# Patient Record
Sex: Female | Born: 1990
Health system: Southern US, Community
[De-identification: ages and names within clinical notes are randomized; demographics above are authoritative.]

## PROBLEM LIST (undated history)

## (undated) ENCOUNTER — Inpatient Hospital Stay (HOSPITAL_COMMUNITY): Payer: Self-pay

## (undated) DIAGNOSIS — F1920 Other psychoactive substance dependence, uncomplicated: Secondary | ICD-10-CM

## (undated) DIAGNOSIS — F1120 Opioid dependence, uncomplicated: Secondary | ICD-10-CM

## (undated) DIAGNOSIS — R630 Anorexia: Secondary | ICD-10-CM

## (undated) DIAGNOSIS — F1911 Other psychoactive substance abuse, in remission: Secondary | ICD-10-CM

## (undated) DIAGNOSIS — B182 Chronic viral hepatitis C: Secondary | ICD-10-CM

## (undated) DIAGNOSIS — F1111 Opioid abuse, in remission: Secondary | ICD-10-CM

## (undated) DIAGNOSIS — O98419 Viral hepatitis complicating pregnancy, unspecified trimester: Secondary | ICD-10-CM

## (undated) DIAGNOSIS — B1920 Unspecified viral hepatitis C without hepatic coma: Secondary | ICD-10-CM

## (undated) DIAGNOSIS — F1110 Opioid abuse, uncomplicated: Secondary | ICD-10-CM

## (undated) HISTORY — DX: Other psychoactive substance abuse, in remission: F19.11

## (undated) HISTORY — DX: Viral hepatitis complicating pregnancy, unspecified trimester: O98.419

## (undated) HISTORY — DX: Opioid abuse, in remission: F11.11

## (undated) HISTORY — DX: Other psychoactive substance dependence, uncomplicated: F19.20

## (undated) HISTORY — DX: Chronic viral hepatitis C: B18.2

## (undated) HISTORY — DX: Opioid abuse, uncomplicated: F11.10

## (undated) HISTORY — DX: Opioid dependence, uncomplicated: F11.20

## (undated) HISTORY — DX: Unspecified viral hepatitis C without hepatic coma: B19.20

## (undated) HISTORY — PX: WISDOM TOOTH EXTRACTION: SHX21

---

## 2011-09-19 ENCOUNTER — Emergency Department (HOSPITAL_COMMUNITY)
Admission: EM | Admit: 2011-09-19 | Discharge: 2011-09-19 | Disposition: A | Discharge status: Home / Self Care | Payer: Self-pay | Attending: Emergency Medicine | Admitting: Emergency Medicine

## 2011-09-19 ENCOUNTER — Encounter (HOSPITAL_COMMUNITY): Payer: Self-pay | Admitting: Emergency Medicine

## 2011-09-19 DIAGNOSIS — F17200 Nicotine dependence, unspecified, uncomplicated: Secondary | ICD-10-CM | POA: Insufficient documentation

## 2011-09-19 DIAGNOSIS — W261XXA Contact with sword or dagger, initial encounter: Secondary | ICD-10-CM | POA: Insufficient documentation

## 2011-09-19 DIAGNOSIS — M79609 Pain in unspecified limb: Secondary | ICD-10-CM | POA: Insufficient documentation

## 2011-09-19 DIAGNOSIS — W260XXA Contact with knife, initial encounter: Secondary | ICD-10-CM | POA: Insufficient documentation

## 2011-09-19 DIAGNOSIS — S61209A Unspecified open wound of unspecified finger without damage to nail, initial encounter: Secondary | ICD-10-CM | POA: Insufficient documentation

## 2011-09-19 MED ORDER — TETANUS-DIPHTH-ACELL PERTUSSIS 5-2.5-18.5 LF-MCG/0.5 IM SUSP
0.5000 mL | Freq: Once | INTRAMUSCULAR | Status: AC
  Administered 2011-09-19: 0.5 mL via INTRAMUSCULAR
  Filled 2011-09-19: qty 0.5

## 2011-09-19 MED ORDER — HYDROCODONE-ACETAMINOPHEN 5-325 MG PO TABS: 1.0000 | ORAL_TABLET | ORAL | Status: AC | PRN

## 2011-09-19 MED ORDER — SILVER NITRATE-POT NITRATE 75-25 % EX MISC
3.0000 | CUTANEOUS | Status: AC
  Administered 2011-09-19: 3 via TOPICAL
  Filled 2011-09-19: qty 3

## 2011-09-19 NOTE — ED Notes (Signed)
Patient cut tip of finger bleeding currently controled with ED interventions pressure dressing applied. Patient denies pain

## 2011-09-19 NOTE — ED Provider Notes (Signed)
Medical screening examination/treatment/procedure(s) were performed by non-physician practitioner and as supervising physician I was immediately available for consultation/collaboration.   Edson Deridder L Ugochukwu Chichester, MD 09/19/11 2318 

## 2011-09-19 NOTE — Discharge Instructions (Signed)
Fingertip Injuries and Amputations Fingertip injuries are common and often get injured because they are last to escape when pulling your hand out of harm's way. You have amputated (cut off) part of your finger. How this turns out depends largely on how much was amputated. If just the tip is amputated, often the end of the finger will grow back and the finger may return to much the same as it was before the injury.  If more of the finger is missing, your caregiver has done the best with the tissue remaining to allow you to keep as much finger as is possible. Your caregiver after checking your injury has tried to leave you with a painless fingertip that has durable, feeling skin. If possible, your caregiver has tried to maintain the finger's length and appearance and preserve its fingernail.  Please read the instructions outlined below and refer to this sheet in the next few weeks. These instructions provide you with general information on caring for yourself. Your caregiver may also give you specific instructions. While your treatment has been done according to the most current medical practices available, unavoidable complications occasionally occur. If you have any problems or questions after discharge, please call your caregiver. HOME CARE INSTRUCTIONS   You may resume normal diet and activities as directed or allowed.   Keep your hand elevated above the level of your heart. This helps decrease pain and swelling.   Keep ice packs (or a bag of ice wrapped in a towel) on the injured area for 15 to 20 minutes, 3 to 4 times per day, for the first two days.   Change dressings if necessary or as directed.   Clean the wound daily or as directed.   Only take over-the-counter or prescription medicines for pain, discomfort, or fever as directed by your caregiver.   Keep appointments as directed.  SEEK IMMEDIATE MEDICAL CARE IF:  You develop redness, swelling, numbness or increasing pain in the wound.     There is pus coming from the wound.   You develop an unexplained oral temperature above 102 F (38.9 C) or as your caregiver suggests.   There is a foul (bad) smell coming from the wound or dressing.   There is a breaking open of the wound (edges not staying together) after sutures or staples have been removed.  MAKE SURE YOU:   Understand these instructions.   Will watch your condition.   Will get help right away if you are not doing well or get worse.  Document Released: 06/09/2005 Document Revised: 03/31/2011 Document Reviewed: 05/08/2008 [employer] Patient Information 2012 [employer]. 

## 2011-09-19 NOTE — ED Notes (Addendum)
Laceration to R middle finger around 6:30 while closing a pocket knife. Pt cut approx 1-2 cm section of skin off finger.  New bandage applied on arrival.

## 2011-09-19 NOTE — ED Provider Notes (Signed)
History     CSN: [id number]  Arrival date & time 09/19/11  2003   First MD Initiated Contact with Patient 09/19/11 2136      Chief Complaint  Patient presents with  . Extremity Laceration    (Consider location/radiation/quality/duration/timing/severity/associated sxs/prior treatment) HPI Comments: Patient here after closing a pocket knife and cutting off a small amount of the pad of her 3rd middle finger - states unable to stop the bleeding.  Patient is a 21 y.o. female presenting with skin laceration. The history is provided by the patient. No language interpreter was used.  Laceration  The incident occurred 1 to 2 hours ago. Pain location: right middle finger. The laceration is 1 cm in size. The laceration mechanism was a a clean knife. The pain is at a severity of 4/10. The pain is moderate. The pain has been constant since onset. She reports no foreign bodies present. Her tetanus status is out of date.    History reviewed. No pertinent past medical history.  History reviewed. No pertinent past surgical history.  No family history on file.  History  Substance Use Topics  . Smoking status: Current Everyday Smoker  . Smokeless tobacco: Not on file  . Alcohol Use: No    OB History    Grav Para Term Preterm Abortions TAB SAB Ect Mult Living                  Review of Systems  Skin: Positive for wound.  All other systems reviewed and are negative.    Allergies  Penicillins  Home Medications  No current outpatient prescriptions on file.  BP 104/63  Pulse 84  Temp(Src) 98 F (36.7 C) (Oral)  Resp 18  SpO2 99%  LMP 09/15/2011  Physical Exam  Nursing note and vitals reviewed. Constitutional: She is oriented to person, place, and time. She appears well-developed and well-nourished. No distress.  HENT:  Head: Normocephalic and atraumatic.  Right Ear: External ear normal.  Left Ear: External ear normal.  Nose: Nose normal.  Mouth/Throat: Oropharynx is clear  and moist. No oropharyngeal exudate.  Eyes: Conjunctivae are normal. Pupils are equal, round, and reactive to light. No scleral icterus.  Neck: Normal range of motion. Neck supple.  Cardiovascular: Normal rate, regular rhythm and normal heart sounds.  Exam reveals no gallop and no friction rub.   No murmur heard. Pulmonary/Chest: Effort normal and breath sounds normal. She exhibits no tenderness.  Abdominal: Soft. Bowel sounds are normal. She exhibits no distension. There is no tenderness.  Musculoskeletal: She exhibits tenderness. She exhibits no edema.       Avulsion of 1-2 cm off the pad of the distal portion of right middle finger - continues to bleed.  Lymphadenopathy:    She has no cervical adenopathy.  Neurological: She is alert and oriented to person, place, and time. No cranial nerve deficit.  Skin: Skin is warm and dry.  Psychiatric: She has a normal mood and affect. Her behavior is normal. Judgment and thought content normal.    ED Course  Cauterization Date/Time: 09/19/2011 10:48 PM Performed by: Marisue Humble, Shawnee Gambone C. Authorized by: Patrecia Pour Consent: Verbal consent obtained. Written consent not obtained. Risks and benefits: risks, benefits and alternatives were discussed Consent given by: patient Patient understanding: patient states understanding of the procedure being performed Patient consent: the patient's understanding of the procedure does not match consent given Procedure consent: procedure consent does not match procedure scheduled Relevant documents: relevant documents not present or  verified Test results: test results not available Site marked: the operative site was not marked Imaging studies: imaging studies not available Patient identity confirmed: verbally with patient and arm band Time out: Immediately prior to procedure a "time out" was called to verify the correct patient, procedure, equipment, support staff and site/side marked as  required. Preparation: Patient was prepped and draped in the usual sterile fashion. Local anesthesia used: yes Anesthesia: digital block Local anesthetic: lidocaine 2% without epinephrine Anesthetic total: 4 ml Patient sedated: no Patient tolerance: Patient tolerated the procedure well with no immediate complications. Comments: Initially tried to cauterize the wound with silver nitrate sticks but was unable to obtain hemostasis.  After this, used the quick clot powder and held pressure for 5 minutes and was able to obtain hemostasis - patient tolerated procedure well.   (including critical care time)  Labs Reviewed - No data to display No results found.   Right middle fingertip avulsion    MDM  Patient here with right middle fingertip avulsion - wound was not hemostatic but I was finally able to obtain hemostasis with quick clot powder - patent tolerated procedure well - will discharge home = she should return with any further concerns.        Izola Price [place], [place] 09/19/11 2252

## 2011-09-28 ENCOUNTER — Encounter (HOSPITAL_COMMUNITY): Payer: Self-pay | Admitting: *Deleted

## 2011-09-28 ENCOUNTER — Emergency Department (HOSPITAL_COMMUNITY)
Admission: EM | Admit: 2011-09-28 | Discharge: 2011-09-28 | Disposition: A | Discharge status: Home / Self Care | Payer: Self-pay | Attending: Emergency Medicine | Admitting: Emergency Medicine

## 2011-09-28 DIAGNOSIS — X58XXXA Exposure to other specified factors, initial encounter: Secondary | ICD-10-CM | POA: Insufficient documentation

## 2011-09-28 DIAGNOSIS — S61209A Unspecified open wound of unspecified finger without damage to nail, initial encounter: Secondary | ICD-10-CM | POA: Insufficient documentation

## 2011-09-28 DIAGNOSIS — R000 Tachycardia, unspecified: Secondary | ICD-10-CM | POA: Insufficient documentation

## 2011-09-28 DIAGNOSIS — M79609 Pain in unspecified limb: Secondary | ICD-10-CM | POA: Insufficient documentation

## 2011-09-28 DIAGNOSIS — S61219A Laceration without foreign body of unspecified finger without damage to nail, initial encounter: Secondary | ICD-10-CM

## 2011-09-28 MED ORDER — SULFAMETHOXAZOLE-TMP DS 800-160 MG PO TABS
1.0000 | ORAL_TABLET | Freq: Once | ORAL | Status: AC
  Administered 2011-09-28: 1 via ORAL
  Filled 2011-09-28: qty 1

## 2011-09-28 MED ORDER — SULFAMETHOXAZOLE-TRIMETHOPRIM 800-160 MG PO TABS: 1.0000 | ORAL_TABLET | Freq: Two times a day (BID) | ORAL | Status: AC

## 2011-09-28 MED ORDER — HYDROCODONE-ACETAMINOPHEN 5-325 MG PO TABS: 1.0000 | ORAL_TABLET | ORAL | Status: AC | PRN

## 2011-09-28 NOTE — ED Notes (Signed)
Cut right middle finger with pocket knife x 5 days ago.  Seen at [facility].  States pain is not better, thinks has infection in middle finger.

## 2011-09-28 NOTE — Discharge Instructions (Signed)
Fingertip Injuries and Amputations Fingertip injuries are common and often get injured because they are last to escape when pulling your hand out of harm's way. You have amputated (cut off) part of your finger. How this turns out depends largely on how much was amputated. If just the tip is amputated, often the end of the finger will grow back and the finger may return to much the same as it was before the injury.  If more of the finger is missing, your caregiver has done the best with the tissue remaining to allow you to keep as much finger as is possible. Your caregiver after checking your injury has tried to leave you with a painless fingertip that has durable, feeling skin. If possible, your caregiver has tried to maintain the finger's length and appearance and preserve its fingernail.  Please read the instructions outlined below and refer to this sheet in the next few weeks. These instructions provide you with general information on caring for yourself. Your caregiver may also give you specific instructions. While your treatment has been done according to the most current medical practices available, unavoidable complications occasionally occur. If you have any problems or questions after discharge, please call your caregiver. HOME CARE INSTRUCTIONS   You may resume normal diet and activities as directed or allowed.   Keep your hand elevated above the level of your heart. This helps decrease pain and swelling.   Keep ice packs (or a bag of ice wrapped in a towel) on the injured area for 15 to 20 minutes, 3 to 4 times per day, for the first two days.   Change dressings if necessary or as directed.   Clean the wound daily or as directed.   Only take over-the-counter or prescription medicines for pain, discomfort, or fever as directed by your caregiver.   Keep appointments as directed.  SEEK IMMEDIATE MEDICAL CARE IF:  You develop redness, swelling, numbness or increasing pain in the wound.     There is pus coming from the wound.   You develop an unexplained oral temperature above 102 F (38.9 C) or as your caregiver suggests.   There is a foul (bad) smell coming from the wound or dressing.   There is a breaking open of the wound (edges not staying together) after sutures or staples have been removed.  MAKE SURE YOU:   Understand these instructions.   Will watch your condition.   Will get help right away if you are not doing well or get worse.  Document Released: 06/09/2005 Document Revised: 03/31/2011 Document Reviewed: 05/08/2008 [employer] Patient Information 2012 [employer]. 

## 2011-09-28 NOTE — ED Provider Notes (Signed)
History   This chart was scribed for [employer]. Colon Branch, MD by Clarita Crane. The patient was seen in room APA17/APA17. Patient's care was started at 1336.    CSN: [id number]  Arrival date & time 09/28/11  1336   First MD Initiated Contact with Patient 09/28/11 1359      Chief Complaint  Patient presents with  . Hand Pain    (Consider location/radiation/quality/duration/timing/severity/associated sxs/prior treatment) HPI Nicole Moss is a 21 y.o. female who presents to the Emergency Department complaining of constant moderate pain to right 3rd finger onset several days ago and persistent since. Patient reports she was evaluated at [facility] ED 5 days ago for severe laceration to tip of right 3rd finger and treated with application fo silver nitrate and advised to keep finger wrapped. Patient reports she has kept right 3rd finger wrapped in gauze and applied neosporin to laceration site. Denies numbness, tingling, fever.    History reviewed. No pertinent past medical history.  Past Surgical History  Procedure Date  . Wisdom tooth extraction     No family history on file.  History  Substance Use Topics  . Smoking status: Current Everyday Smoker  . Smokeless tobacco: Not on file  . Alcohol Use: No    OB History    Grav Para Term Preterm Abortions TAB SAB Ect Mult Living                  Review of Systems 10 Systems reviewed and are negative for acute change except as noted in the HPI.  Allergies  Penicillins  Home Medications   Current Outpatient Rx  Name Route Sig Dispense Refill  . HYDROCODONE-ACETAMINOPHEN 5-325 MG PO TABS Oral Take 1 tablet by mouth every 4 (four) hours as needed for pain. 10 tablet 0    BP 105/78  Pulse 130  Temp(Src) 98.5 F (36.9 C) (Oral)  Resp 16  Ht 5\' 8"  (1.727 m)  Wt 100 lb (45.36 kg)  BMI 15.21 kg/m2  SpO2 98%  LMP 09/15/2011  Physical Exam  Nursing note and vitals reviewed. Constitutional: She is oriented to person,  place, and time. She appears well-developed and well-nourished. No distress.  HENT:  Head: Normocephalic and atraumatic.  Eyes: EOM are normal.  Neck: Neck supple. No tracheal deviation present.  Cardiovascular: Tachycardia present.   Pulmonary/Chest: Effort normal. No respiratory distress.  Abdominal: Soft. She exhibits no distension.  Musculoskeletal: Normal range of motion. She exhibits no edema.       Right 3rd finger with healing laceration to distal phalanx but no signs of infection noted.   Neurological: She is alert and oriented to person, place, and time. No sensory deficit.       Distal sensation intact.   Skin: Skin is warm and dry.  Psychiatric: She has a normal mood and affect. Her behavior is normal.    ED Course  Procedures (including critical care time)  DIAGNOSTIC STUDIES: Oxygen Saturation is 98% on room air, normal by my interpretation.    COORDINATION OF CARE: 2:27PM- Patient explained instructions for home care and intent to d/c home. Patient agrees with plan. Will wrap finger prior to discharge.   MDM  Patient with laceration to the tip of her right middle finger that is healing slowly. Painful with palpation however no frank evidence of infection. Dressing provided. Initiated antibiotic therapy. Pt stable in ED with no significant deterioration in condition.The patient appears reasonably screened and/or stabilized for discharge and I doubt any other  medical condition or other [facility] requiring further screening, evaluation, or treatment in the ED at this time prior to discharge.  I personally performed the services described in this documentation, which was scribed in my presence. The recorded information has been reviewed and considered.  MDM Reviewed: nursing note, vitals and previous chart Reviewed previous: x-ray        Nicole Moss. Colon Branch, MD 10/01/11 1718

## 2012-01-09 ENCOUNTER — Emergency Department (HOSPITAL_COMMUNITY)
Admission: EM | Admit: 2012-01-09 | Discharge: 2012-01-09 | Disposition: A | Discharge status: Home / Self Care | Payer: No Typology Code available for payment source | Attending: Emergency Medicine | Admitting: Emergency Medicine

## 2012-01-09 ENCOUNTER — Encounter (HOSPITAL_COMMUNITY): Payer: Self-pay | Admitting: *Deleted

## 2012-01-09 ENCOUNTER — Emergency Department (HOSPITAL_COMMUNITY): Payer: No Typology Code available for payment source

## 2012-01-09 DIAGNOSIS — S060X9A Concussion with loss of consciousness of unspecified duration, initial encounter: Secondary | ICD-10-CM

## 2012-01-09 DIAGNOSIS — IMO0002 Reserved for concepts with insufficient information to code with codable children: Secondary | ICD-10-CM | POA: Insufficient documentation

## 2012-01-09 DIAGNOSIS — E041 Nontoxic single thyroid nodule: Secondary | ICD-10-CM | POA: Insufficient documentation

## 2012-01-09 DIAGNOSIS — R51 Headache: Secondary | ICD-10-CM | POA: Insufficient documentation

## 2012-01-09 DIAGNOSIS — M542 Cervicalgia: Secondary | ICD-10-CM | POA: Insufficient documentation

## 2012-01-09 DIAGNOSIS — S060XAA Concussion with loss of consciousness status unknown, initial encounter: Secondary | ICD-10-CM | POA: Insufficient documentation

## 2012-01-09 HISTORY — DX: Anorexia: R63.0

## 2012-01-09 MED ORDER — KETOROLAC TROMETHAMINE 60 MG/2ML IM SOLN: 30.0000 mg | Freq: Once | INTRAMUSCULAR | Status: DC

## 2012-01-09 MED ORDER — HYDROCODONE-ACETAMINOPHEN 5-325 MG PO TABS: 1.0000 | ORAL_TABLET | Freq: Four times a day (QID) | ORAL | Status: AC | PRN

## 2012-01-09 MED ORDER — KETOROLAC TROMETHAMINE 60 MG/2ML IM SOLN
60.0000 mg | Freq: Once | INTRAMUSCULAR | Status: DC
  Filled 2012-01-09: qty 2

## 2012-01-09 NOTE — ED Notes (Signed)
Patient left prior to administration of pain medication. Discharge instructions were explained to her and she and her parents verbalized understanding of instructions.
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
## 2012-01-09 NOTE — ED Notes (Addendum)
EMS: Vs-128/98, hr 110. wnl sao2, rr 16 - VS within normal range.
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
## 2012-03-19 ENCOUNTER — Encounter (HOSPITAL_COMMUNITY): Payer: Self-pay | Admitting: Emergency Medicine

## 2012-03-19 ENCOUNTER — Emergency Department (HOSPITAL_COMMUNITY)
Admission: EM | Admit: 2012-03-19 | Discharge: 2012-03-20 | Disposition: A | Discharge status: Home / Self Care | Payer: Self-pay | Attending: Emergency Medicine | Admitting: Emergency Medicine

## 2012-03-19 DIAGNOSIS — F17200 Nicotine dependence, unspecified, uncomplicated: Secondary | ICD-10-CM | POA: Insufficient documentation

## 2012-03-19 DIAGNOSIS — F1910 Other psychoactive substance abuse, uncomplicated: Secondary | ICD-10-CM

## 2012-03-19 DIAGNOSIS — T400X1A Poisoning by opium, accidental (unintentional), initial encounter: Secondary | ICD-10-CM | POA: Insufficient documentation

## 2012-03-19 DIAGNOSIS — T40601A Poisoning by unspecified narcotics, accidental (unintentional), initial encounter: Secondary | ICD-10-CM | POA: Insufficient documentation

## 2012-03-19 DIAGNOSIS — R45851 Suicidal ideations: Secondary | ICD-10-CM | POA: Insufficient documentation

## 2012-03-19 DIAGNOSIS — I498 Other specified cardiac arrhythmias: Secondary | ICD-10-CM | POA: Insufficient documentation

## 2012-03-19 MED ORDER — NALOXONE HCL 1 MG/ML IJ SOLN
INTRAMUSCULAR | Status: AC
  Filled 2012-03-19: qty 6

## 2012-03-19 NOTE — ED Notes (Signed)
Bed:RESB<BR> Expected date:<BR> Expected time:<BR> Means of arrival:<BR> Comments:<BR> EMS-OD

## 2012-03-19 NOTE — ED Notes (Signed)
Per EMS [facility], initial medic gave 4mg  Narcan IVP. Pt alert & oriented, enroute to ED pt began to become drowsy, pt given additional Narcan 1mg  IVP. # 20 L AC. Tremors noted on arrival to ED.

## 2012-03-20 DIAGNOSIS — F1110 Opioid abuse, uncomplicated: Secondary | ICD-10-CM

## 2012-03-20 LAB — RAPID URINE DRUG SCREEN, HOSP PERFORMED
Amphetamines: NOT DETECTED
Barbiturates: NOT DETECTED
Tetrahydrocannabinol: POSITIVE — AB

## 2012-03-20 LAB — COMPREHENSIVE METABOLIC PANEL
AST: 90 U/L — ABNORMAL HIGH (ref 0–37)
Albumin: 3.8 g/dL (ref 3.5–5.2)
Calcium: 9 mg/dL (ref 8.4–10.5)
Chloride: 103 mEq/L (ref 96–112)
Creatinine, Ser: 0.71 mg/dL (ref 0.50–1.10)
Total Protein: 6.3 g/dL (ref 6.0–8.3)

## 2012-03-20 LAB — CBC
MCH: 31.6 pg (ref 26.0–34.0)
MCHC: 34.9 g/dL (ref 30.0–36.0)
MCV: 90.6 fL (ref 78.0–100.0)
Platelets: 171 10*3/uL (ref 150–400)
RDW: 12.6 % (ref 11.5–15.5)
WBC: 12.5 10*3/uL — ABNORMAL HIGH (ref 4.0–10.5)

## 2012-03-20 LAB — SALICYLATE LEVEL: Salicylate Lvl: <2 mg/dL — ABNORMAL LOW (ref 2.8–20.0)

## 2012-03-20 MED ORDER — ONDANSETRON HCL 4 MG PO TABS: 4.0000 mg | ORAL_TABLET | Freq: Three times a day (TID) | ORAL | Status: DC | PRN

## 2012-03-20 MED ORDER — NICOTINE 21 MG/24HR TD PT24
21.0000 mg | MEDICATED_PATCH | Freq: Every day | TRANSDERMAL | Status: DC
  Administered 2012-03-20: 21 mg via TRANSDERMAL
  Filled 2012-03-20: qty 1

## 2012-03-20 MED ORDER — ACETAMINOPHEN 325 MG PO TABS: 650.0000 mg | ORAL_TABLET | ORAL | Status: DC | PRN

## 2012-03-20 MED ORDER — ALUM & MAG HYDROXIDE-SIMETH 200-200-20 MG/5ML PO SUSP: 30.0000 mL | ORAL | Status: DC | PRN

## 2012-03-20 MED ORDER — IBUPROFEN 200 MG PO TABS: 600.0000 mg | ORAL_TABLET | Freq: Three times a day (TID) | ORAL | Status: DC | PRN

## 2012-03-20 NOTE — ED Notes (Signed)
Sitter at bedside.

## 2012-03-20 NOTE — ED Notes (Signed)
IV d/c from left AC 20 gauge. Catheter intact.

## 2012-03-20 NOTE — ED Notes (Signed)
Mother at bedside, she has taken out IVC papers on pt, [employer] will serve pt. Pt alert and talking to family. VSS.

## 2012-03-20 NOTE — ED Notes (Signed)
Pt seen by [facility]

## 2012-03-20 NOTE — ED Notes (Signed)
MD at bedside. 

## 2012-03-20 NOTE — ED Notes (Signed)
Pt family took pt belongings home.

## 2012-03-20 NOTE — BH Assessment (Signed)
Assessment Note   Nicole Moss is an 21 y.o. female who presents under IVC to [facility] after boyfriend called EMS when pt was unresponsive after swallowing half a fentanyl patch. Pt denies it was suicide attempt although in IVC paperwork mother stated pt endorsed SI. Pt denies SI and HI and states she took fentanyl in order to get high. Pt reports opiate dependence but doesn't want detox or substance abuse treatment. She has no hx of suicide attempts. She endorses irritable mood and affect is appropriate to circumstance. Current stressors include recent job loss from [employer], conflict with her mother and concern that her father might have thyroid cancer. She has no hx of mental health treatment. Pt states she has lost 20 lbs within last 6 mos without trying. She has used either opana, percocet or oxycontin almost daily for past 3 years, depending on which substance she could obtain. She smokes cannabis - 1 g per week. She injected heroin for 6 mos when she was 17.   Axis I: Opiate Dependence Axis II: Deferred Axis III:  Past Medical History  Diagnosis Date  . Anorexia    Axis IV: housing problems, other psychosocial or environmental problems, problems related to social environment and problems with primary support group Axis V: 41-50 serious symptoms  Past Medical History:  Past Medical History  Diagnosis Date  . Anorexia     Past Surgical History  Procedure Date  . Wisdom tooth extraction     Family History: No family history on file.  Social History:  reports that she has been smoking.  She does not have any smokeless tobacco history on file. She reports that she does not drink alcohol or use illicit drugs.  Additional Social History:  Alcohol / Drug Use Pain Medications: pt admits abuse Prescriptions: see PTA meds Over the Counter: na History of alcohol / drug use?: Yes Substance #1 Name of Substance 1: opana/percocet/oxycontin used interchangeably 1 - Age of First Use: 17 1  - Amount (size/oz): depends on how what she can obtain 1 - Frequency: daily if possible 1 - Duration: 3 years 1 - Last Use / Amount: 03/18/12 - doesn't remember amount or substance Substance #2 Name of Substance 2: heroin 2 - Age of First Use: 17 2 - Amount (size/oz): varied 2 - Frequency: daily 2 - Duration: 6 mos during age 60 2 - Last Use / Amount: age 67 Substance #3 Name of Substance 3: cannabis 3 - Age of First Use: 17 3 - Amount (size/oz): 1 g per week 3 - Duration: 3 years 3 - Last Use / Amount: 03/18/12   CIWA: CIWA-Ar BP: 93/51 mmHg Pulse Rate: 68  COWS:    Allergies:  Allergies  Allergen Reactions  . Penicillins Nausea And Vomiting    Home Medications:  (Not in a hospital admission)  OB/GYN Status:  Patient's last menstrual period was 11/18/2011.  General Assessment Data Location of Assessment: [facility] ED Living Arrangements: Spouse/significant other Can pt return to current living arrangement?: Yes Admission Status: Involuntary Is patient capable of signing voluntary admission?: Yes Transfer from: Home Referral Source: Other (boyfriend called EMS)  Education Status Is patient currently in school?: No Highest grade of school patient has completed: 72 Name of school: high school in [place] Contact person: na  Risk to self Suicidal Ideation: No Suicidal Intent: No Is patient at risk for suicide?: No Suicidal Plan?: No Access to Means: No What has been your use of drugs/alcohol within the last 12 months?:  daily opiate use Previous Attempts/Gestures: No How many times?: 0  Other Self Harm Risks: na Intentional Self Injurious Behavior: None Family Suicide History: No Recent stressful life event(s): Conflict (Comment);Job Loss (conflict w/ mother, job loss) Persecutory voices/beliefs?: No Depression: No Depression Symptoms: Feeling angry/irritable;Isolating Substance abuse history and/or treatment for substance abuse?: No Suicide prevention  information given to non-admitted patients: Not applicable  Risk to Others Homicidal Ideation: No Thoughts of Harm to Others: No Current Homicidal Intent: No Current Homicidal Plan: No Access to Homicidal Means: No Identified Victim: na History of harm to others?: No Assessment of Violence: None Noted Violent Behavior Description: pt calm during assessment Does patient have access to weapons?: No Criminal Charges Pending?: No Does patient have a court date: No  Psychosis Hallucinations: None noted Delusions: None noted  Mental Status Report Appear/Hygiene: Other (Comment) (unremarkabkle) Eye Contact: Good Motor Activity: Freedom of movement Speech: Logical/coherent;Slurred Level of Consciousness: Drowsy Mood: Irritable Affect: Appropriate to circumstance Anxiety Level: Moderate Thought Processes: Coherent;Relevant Judgement: Impaired Orientation: Person;Time;Situation;Place;Appropriate for developmental age Obsessive Compulsive Thoughts/Behaviors: None  Cognitive Functioning Concentration: Normal Memory: Recent Intact;Remote Intact IQ: Average Insight: Poor Impulse Control: Poor Appetite: Poor Weight Loss: 20  (6 mos) Weight Gain: 0  Sleep: No Change Total Hours of Sleep: 6  Vegetative Symptoms: None  ADLScreening [facility] Assessment [facility]) Patient's cognitive ability adequate to safely complete daily activities?: Yes Patient able to express need for assistance with ADLs?: Yes Independently performs ADLs?: Yes (appropriate for developmental age)  Abuse/Neglect [facility]) Physical Abuse: Denies Verbal Abuse: Denies Sexual Abuse: Denies  Prior Inpatient Therapy Prior Inpatient Therapy: No Prior Therapy Dates: na Prior Therapy Facilty/Provider(s): na Reason for Treatment: na  Prior Outpatient Therapy Prior Outpatient Therapy: No Prior Therapy Dates: na Prior Therapy Facilty/Provider(s): na Reason for Treatment: na  ADL Screening (condition at time of  admission) Patient's cognitive ability adequate to safely complete daily activities?: Yes Patient able to express need for assistance with ADLs?: Yes Independently performs ADLs?: Yes (appropriate for developmental age) Weakness of Legs: None Weakness of Arms/Hands: None  Home Assistive Devices/Equipment Home Assistive Devices/Equipment: None    Abuse/Neglect Assessment (Assessment to be complete while patient is alone) Physical Abuse: Denies Verbal Abuse: Denies Sexual Abuse: Denies Exploitation of patient/patient's resources: Denies Self-Neglect: Denies Values / Beliefs Cultural Requests During Hospitalization: None Spiritual Requests During Hospitalization: None   Advance Directives (For Healthcare) Advance Directive: Patient does not have advance directive;Patient would not like information    Additional Information 1:1 In Past 12 Months?: No CIRT Risk: No Elopement Risk: No Does patient have medical clearance?: No     Disposition:  Disposition Disposition of Patient:  (pending telepsych)  On Site Evaluation by:   Reviewed with Physician:     Donnamarie Rossetti P 03/20/2012 6:12 AM

## 2012-03-20 NOTE — ED Notes (Signed)
[facility] aware of pt

## 2012-03-20 NOTE — ED Notes (Signed)
Pts mother at bedside, mother states she is going to Therapist, occupational for IVC papers, mother states pt has stated she was going to harm herself. Pt denies this to staff. Mother states father supplies pt with "pills"

## 2012-03-20 NOTE — B[facility] Counselor (Signed)
Patient evaluated by psychiatrist Dr. Elsie Saas. He notes Recommended outpatient substance abuse treatment and counseling. Patient does not meet criteria for acute psychiatric hospitalization. No medication was recommended at this time.Writer contacted EDP-Dr. Juleen China and made him aware of patient's disposition and recommendations by Dr. Henrene Hawking. Patient's nurse was also made aware of patient's disposition. He agreed to discharge patient home. Writer provided patient with the appropriate out-patient referrals to local therapist and psychiatrist in the community. Patient also given the contact information to mental health and mobile crises for emergency's as needed. Patient agreeable to follow up.

## 2012-03-20 NOTE — ED Notes (Signed)
Report to [facility] in TCU

## 2012-03-20 NOTE — ED Notes (Signed)
Pt yelling at mother. Mother and other family to wait in waiting room.

## 2012-03-20 NOTE — ED Provider Notes (Addendum)
History     CSN: [id number]  Arrival date & time 03/19/12  2329   First MD Initiated Contact with Patient 03/19/12 2333      Chief Complaint  Patient presents with  . Drug Overdose    (Consider location/radiation/quality/duration/timing/severity/associated sxs/prior treatment) HPI 21 year old female presents from home via EMS after a period of unresponsiveness due to drug overdose. Patient reports she took one half of a fentanyl patch, does not know the dose. She denies any other coingestions. She does report that she smokes marijuana regularly. This was not a suicide attempt. Per report, patient was found bradycardic and apnea, bystanders started chest compressions. Patient has received a total of 5 mg of Narcan. Patient reports she took phenyl around 8:30 or 9:00 this evening. Her last Narcan dose was at 2300. Patient is awake and alert. She denies previous history of overdose.  Past Medical History  Diagnosis Date  . Anorexia     Past Surgical History  Procedure Date  . Wisdom tooth extraction     No family history on file.  History  Substance Use Topics  . Smoking status: Current Everyday Smoker  . Smokeless tobacco: Not on file  . Alcohol Use: No    OB History    Grav Para Term Preterm Abortions TAB SAB Ect Mult Living                  Review of Systems  All other systems reviewed and are negative.    Allergies  Penicillins  Home Medications  No current outpatient prescriptions on file.  BP 110/73  Pulse 97  Temp 97.6 F (36.4 C) (Oral)  Resp 20  SpO2 100%  LMP 11/18/2011  Physical Exam  Nursing note and vitals reviewed. Constitutional: She is oriented to person, place, and time. She appears well-developed and well-nourished.  HENT:  Head: Normocephalic and atraumatic.  Right Ear: External ear normal.  Left Ear: External ear normal.  Nose: Nose normal.  Mouth/Throat: Oropharynx is clear and moist.  Eyes: Conjunctivae and EOM are normal.  Pupils are equal, round, and reactive to light.  Neck: Normal range of motion. Neck supple. No JVD present. No tracheal deviation present. No thyromegaly present.  Cardiovascular: Normal rate, regular rhythm, normal heart sounds and intact distal pulses.  Exam reveals no gallop and no friction rub.   No murmur heard. Pulmonary/Chest: Effort normal and breath sounds normal. No stridor. No respiratory distress. She has no wheezes. She has no rales. She exhibits no tenderness.  Abdominal: Soft. Bowel sounds are normal. She exhibits no distension and no mass. There is no tenderness. There is no rebound and no guarding.  Musculoskeletal: Normal range of motion. She exhibits no edema and no tenderness.  Lymphadenopathy:    She has no cervical adenopathy.  Neurological: She is alert and oriented to person, place, and time. She has normal reflexes. She exhibits normal muscle tone. Coordination normal.  Skin: Skin is warm and dry. No rash noted. No erythema. No pallor.  Psychiatric: She has a normal mood and affect. Her behavior is normal. Judgment and thought content normal.    ED Course  Procedures (including critical care time)  Labs Reviewed  CBC - Abnormal; Notable for the following:    WBC 12.5 (*)     HCT 35.5 (*)     All other components within normal limits  COMPREHENSIVE METABOLIC PANEL - Abnormal; Notable for the following:    AST 90 (*)     ALT 61 (*)  Total Bilirubin 0.2 (*)     All other components within normal limits  URINE RAPID DRUG SCREEN (HOSP PERFORMED) - Abnormal; Notable for the following:    Benzodiazepines POSITIVE (*)     Tetrahydrocannabinol POSITIVE (*)     All other components within normal limits  SALICYLATE LEVEL - Abnormal; Notable for the following:    Salicylate Lvl <2.0 (*)     All other components within normal limits  GLUCOSE, CAPILLARY  ETHANOL  ACETAMINOPHEN LEVEL  PREGNANCY, URINE    Date: 03/19/2012  Rate: 85  Rhythm: normal sinus rhythm   QRS Axis: normal  Intervals: normal  ST/T Wave abnormalities: normal  Conduction Disutrbances:none  Narrative Interpretation:   Old EKG Reviewed: none available    1. Accidental fentanyl overdose   2. Drug abuse   3. Suicide ideation       MDM  21 year old female status post fentanyl overdose. We'll monitor closely here for need for additional Narcan. The timeline seems a bit off, if she reports eating half of a fentanyl patch at 8:30 with need for additional Narcan up until 11:00.     1:12 AM Per nursing staff patient has made suicidal threats to her mother, who is taken out IVC paperwork. Will start med clearance workup. Patient has remained awake and alert and not requiring any further Narcan at this time  Olivia Mackie, MD 03/20/12 9528  Olivia Mackie, MD 03/20/12 [phone]

## 2012-03-20 NOTE — Consult Note (Signed)
Reason for Consult: Opioid dependence Referring Physician: Dr. Seward Speck is an 21 y.o. female.  HPI: Patient was seen and chart reviewed. Patient reported she has been suffering with the opioid abuse versus dependence for the last 3 years. Patient reported she was living in [place] and than relocated herself with to [place] to get away from the drug environment. Patient has met her fianc/boyfriend about a year ago and went to a party where there used the opioid patch to get high. She chewed half of the fentanyl patch. Patient reported that after going to home suddenly she become unconscious and not breathing in front of her bed. Patient boyfriend called the emergency medical services and her dad. Patient was revived by giving the shot by EMS and she was briefly woke up. Patient was brought into the [facility] long emergency department for further treatment. Patient reported she has been stressed about losing her job at [employer] and having ongoing conflicts with her mother. Patient has no previous history of mental health treatment or substance abuse or detox treatment or rehabilitation services. Patient was not seeking treatment for the substance abuse because she knows several of for friends were abusing even methadone and Suboxone. Her urine drug screen was positive for benzodiazepines and marijuana. She smokes marijuana regularly. Patient denied symptoms of for depression, anxiety, suicidal or homicidal ideation, intentions and plans. Patient has no evidence of psychotic symptoms.  Past Medical History  Diagnosis Date  . Anorexia     Past Surgical History  Procedure Date  . Wisdom tooth extraction     No family history on file.  Social History:  reports that she has been smoking.  She does not have any smokeless tobacco history on file. She reports that she does not drink alcohol or use illicit drugs.  Allergies:  Allergies  Allergen Reactions  . Penicillins Nausea And  Vomiting    Medications: I have reviewed the patient's current medications.  Results for orders placed during the hospital encounter of 03/19/12 (from the past 48 hour(s))  GLUCOSE, CAPILLARY     Status: Normal   Collection Time   03/19/12 11:48 PM      Component Value Range Comment   Glucose-Capillary 75  70 - 99 mg/dL   CBC     Status: Abnormal   Collection Time   03/20/12 12:00 AM      Component Value Range Comment   WBC 12.5 (*) 4.0 - 10.5 K/uL    RBC 3.92  3.87 - 5.11 MIL/uL    Hemoglobin 12.4  12.0 - 15.0 g/dL    HCT 46.9 (*) 62.9 - 46.0 %    MCV 90.6  78.0 - 100.0 fL    MCH 31.6  26.0 - 34.0 pg    MCHC 34.9  30.0 - 36.0 g/dL    RDW 52.8  41.3 - 24.4 %    Platelets 171  150 - 400 K/uL   COMPREHENSIVE METABOLIC PANEL     Status: Abnormal   Collection Time   03/20/12 12:00 AM      Component Value Range Comment   Sodium 139  135 - 145 mEq/L    Potassium 3.5  3.5 - 5.1 mEq/L    Chloride 103  96 - 112 mEq/L    CO2 29  19 - 32 mEq/L    Glucose, Bld 72  70 - 99 mg/dL    BUN 13  6 - 23 mg/dL    Creatinine, Ser 0.10  0.50 - 1.10 mg/dL    Calcium 9.0  8.4 - 16.1 mg/dL    Total Protein 6.3  6.0 - 8.3 g/dL    Albumin 3.8  3.5 - 5.2 g/dL    AST 90 (*) 0 - 37 U/L    ALT 61 (*) 0 - 35 U/L    Alkaline Phosphatase 54  39 - 117 U/L    Total Bilirubin 0.2 (*) 0.3 - 1.2 mg/dL    GFR calc non Af Amer >90  >90 mL/min    GFR calc Af Amer >90  >90 mL/min   ETHANOL     Status: Normal   Collection Time   03/20/12 12:00 AM      Component Value Range Comment   Alcohol, Ethyl (B) <11  0 - 11 mg/dL   ACETAMINOPHEN LEVEL     Status: Normal   Collection Time   03/20/12 12:00 AM      Component Value Range Comment   Acetaminophen (Tylenol), Serum <15.0  10 - 30 ug/mL   SALICYLATE LEVEL     Status: Abnormal   Collection Time   03/20/12 12:00 AM      Component Value Range Comment   Salicylate Lvl <2.0 (*) 2.8 - 20.0 mg/dL   URINE RAPID DRUG SCREEN (HOSP PERFORMED)     Status: Abnormal    Collection Time   03/20/12  1:26 AM      Component Value Range Comment   Opiates NONE DETECTED  NONE DETECTED    Cocaine NONE DETECTED  NONE DETECTED    Benzodiazepines POSITIVE (*) NONE DETECTED    Amphetamines NONE DETECTED  NONE DETECTED    Tetrahydrocannabinol POSITIVE (*) NONE DETECTED    Barbiturates NONE DETECTED  NONE DETECTED   PREGNANCY, URINE     Status: Normal   Collection Time   03/20/12  1:26 AM      Component Value Range Comment   Preg Test, Ur NEGATIVE  NEGATIVE     No results found.  No depression, No anxiety, No psychosis and Positive for anxiety, bad mood, illegal drug usage and prescription drug abuse Blood pressure 98/62, pulse 62, temperature 98.8 F (37.1 C), temperature source Oral, resp. rate 16, last menstrual period 11/18/2011, SpO2 100.00%.   Assessment/Plan: Opiate abuse versus dependence R/O polysubstance abuse vs dependence   Recommended outpatient substance abuse treatment and counseling. Patient does not meet criteria for acute psychiatric hospitalization. No medication was recommended at this time.  Jenne Sellinger,JANARDHAHA R. 03/20/2012, 4:59 PM

## 2012-03-20 NOTE — ED Notes (Signed)
Nicole Moss with [facility] notified of pending IVC

## 2013-01-11 ENCOUNTER — Emergency Department (HOSPITAL_COMMUNITY)
Admission: EM | Admit: 2013-01-11 | Discharge: 2013-01-11 | Disposition: A | Discharge status: Home / Self Care | Payer: Self-pay | Attending: Emergency Medicine | Admitting: Emergency Medicine

## 2013-01-11 ENCOUNTER — Emergency Department (HOSPITAL_COMMUNITY): Payer: Self-pay

## 2013-01-11 ENCOUNTER — Encounter (HOSPITAL_COMMUNITY): Payer: Self-pay | Admitting: *Deleted

## 2013-01-11 DIAGNOSIS — Z87891 Personal history of nicotine dependence: Secondary | ICD-10-CM | POA: Insufficient documentation

## 2013-01-11 DIAGNOSIS — Z880 Allergy status to penicillin: Secondary | ICD-10-CM | POA: Insufficient documentation

## 2013-01-11 DIAGNOSIS — L03213 Periorbital cellulitis: Secondary | ICD-10-CM

## 2013-01-11 DIAGNOSIS — R21 Rash and other nonspecific skin eruption: Secondary | ICD-10-CM | POA: Insufficient documentation

## 2013-01-11 DIAGNOSIS — L0390 Cellulitis, unspecified: Secondary | ICD-10-CM | POA: Insufficient documentation

## 2013-01-11 DIAGNOSIS — L0291 Cutaneous abscess, unspecified: Secondary | ICD-10-CM | POA: Insufficient documentation

## 2013-01-11 MED ORDER — IOHEXOL 300 MG/ML  SOLN
75.0000 mL | Freq: Once | INTRAMUSCULAR | Status: AC | PRN
  Administered 2013-01-11: 75 mL via INTRAVENOUS

## 2013-01-11 MED ORDER — PROMETHAZINE HCL 25 MG PO TABS: 25.0000 mg | ORAL_TABLET | Freq: Four times a day (QID) | ORAL | Status: DC | PRN

## 2013-01-11 MED ORDER — SULFAMETHOXAZOLE-TRIMETHOPRIM 800-160 MG PO TABS: 1.0000 | ORAL_TABLET | Freq: Two times a day (BID) | ORAL | Status: DC

## 2013-01-11 MED ORDER — ONDANSETRON HCL 4 MG/2ML IJ SOLN
4.0000 mg | Freq: Once | INTRAMUSCULAR | Status: AC
  Administered 2013-01-11: 4 mg via INTRAVENOUS
  Filled 2013-01-11: qty 2

## 2013-01-11 MED ORDER — CEPHALEXIN 500 MG PO CAPS: 500.0000 mg | ORAL_CAPSULE | Freq: Four times a day (QID) | ORAL | Status: DC

## 2013-01-11 MED ORDER — TRIAMCINOLONE ACETONIDE 0.1 % EX CREA: TOPICAL_CREAM | Freq: Three times a day (TID) | CUTANEOUS | Status: DC

## 2013-01-11 MED ORDER — DEXTROSE 5 % IV SOLN
1.0000 g | Freq: Once | INTRAVENOUS | Status: AC
  Administered 2013-01-11: 1 g via INTRAVENOUS
  Filled 2013-01-11: qty 10

## 2013-01-11 NOTE — ED Notes (Signed)
Pt woke up with left eye swelling, throbbing pain to left side of face with mild drainage per pt, denies fever

## 2013-01-11 NOTE — ED Notes (Signed)
nad noted prior to dc. Dc instructions reviewed and explained along with f/u care. 4 scripts given to pt. Ambulated out without difficulty.

## 2013-01-12 NOTE — ED Provider Notes (Signed)
History     CSN: [id number]  Arrival date & time 01/11/13  1035   First MD Initiated Contact with Patient 01/11/13 1046      Chief Complaint  Patient presents with  . Facial Swelling    (Consider location/radiation/quality/duration/timing/severity/associated sxs/prior treatment) HPI Comments: Nicole Moss is a 22 y.o. female who presents to the Emergency Department complaining of swelling, redness and pain to her left face and left upper eyelid.  States she woke up on the morning of arrival with symptoms.  She denies recent illness, fever, sore throat, itching , difficulty swallowing or breathing.  She also denies any new medications or exposures to chemicals.  States she took a benadryl prior to arrival and swelling around the eye improved.  She denies redness or eye pain or contact use.    The history is provided by the patient.    Past Medical History  Diagnosis Date  . Anorexia     Past Surgical History  Procedure Laterality Date  . Wisdom tooth extraction      History reviewed. No pertinent family history.  History  Substance Use Topics  . Smoking status: Former Games developer  . Smokeless tobacco: Not on file  . Alcohol Use: No    OB History   Grav Para Term Preterm Abortions TAB SAB Ect Mult Living                  Review of Systems  Constitutional: Negative for fever, chills, activity change and appetite change.  HENT: Positive for facial swelling. Negative for congestion, sore throat, mouth sores, trouble swallowing, neck pain, neck stiffness and ear discharge.   Eyes: Negative for photophobia, redness and itching.  Respiratory: Negative for cough, shortness of breath and wheezing.   Cardiovascular: Negative for chest pain.  Gastrointestinal: Negative for nausea, vomiting and abdominal pain.  Skin: Positive for color change and rash.  Neurological: Positive for facial asymmetry. Negative for dizziness, syncope, speech difficulty, weakness, light-headedness,  numbness and headaches.  Psychiatric/Behavioral: Negative for confusion.  All other systems reviewed and are negative.    Allergies  Penicillins  Home Medications   Current Outpatient Rx  Name  Route  Sig  Dispense  Refill  . acetaminophen (TYLENOL) 325 MG tablet   Oral   Take 325 mg by mouth every 6 (six) hours as needed for pain.         . diphenhydrAMINE (BENADRYL ALLERGY) 25 MG tablet   Oral   Take 25 mg by mouth every 6 (six) hours as needed for itching.         . cephALEXin (KEFLEX) 500 MG capsule   Oral   Take 1 capsule (500 mg total) by mouth 4 (four) times daily. For 10 days   40 capsule   0   . promethazine (PHENERGAN) 25 MG tablet   Oral   Take 1 tablet (25 mg total) by mouth every 6 (six) hours as needed for nausea.   12 tablet   0   . sulfamethoxazole-trimethoprim (SEPTRA DS) 800-160 MG per tablet   Oral   Take 1 tablet by mouth 2 (two) times daily.   20 tablet   0   . triamcinolone cream (KENALOG) 0.1 %   Topical   Apply topically 3 (three) times daily. Do not apply to the face   30 g   0     BP 95/59  Pulse 73  Temp(Src) 98.7 F (37.1 C) (Oral)  Resp 16  Ht 5\' 7"  (  1.702 m)  Wt 98 lb (44.453 kg)  BMI 15.35 kg/m2  SpO2 100%  LMP 01/04/2013  Physical Exam  Nursing note and vitals reviewed. Constitutional: She is oriented to person, place, and time. She appears well-developed and well-nourished. No distress.  HENT:  Head: Normocephalic. Head is with left periorbital erythema. No trismus in the jaw.    Right Ear: Tympanic membrane and ear canal normal.  Left Ear: Tympanic membrane and ear canal normal.  Mouth/Throat: Uvula is midline, oropharynx is clear and moist and mucous membranes are normal. No dental abscesses, edematous or dental caries. No oropharyngeal exudate.  Localized erythema and STS of the left face and left upper eyelid.  No drainage.  No dental abscess.    Eyes: Conjunctivae and EOM are normal. Pupils are equal,  round, and reactive to light.  Neck: Normal range of motion. Neck supple.  Cardiovascular: Normal rate, regular rhythm, normal heart sounds and intact distal pulses.   No murmur heard. Pulmonary/Chest: Effort normal and breath sounds normal. No respiratory distress. She exhibits no tenderness.  Musculoskeletal: Normal range of motion. She exhibits no edema and no tenderness.  Lymphadenopathy:    She has no cervical adenopathy.  Neurological: She is alert and oriented to person, place, and time. She exhibits normal muscle tone. Coordination normal.  Skin: Skin is warm and dry. Rash noted.  Scattered slightly raised erythematous lesions to the left shoulder, upper arm and bilateral thighs.  No extremity edema.  Lesions appear different than face    ED Course  Procedures (including critical care time)  Labs Reviewed - No data to display Ct Orbits W/cm  01/11/2013   *RADIOLOGY REPORT*  Clinical Data: Facial swelling.  Left eye swelling.  CT ORBITS WITH CONTRAST  Technique:  Multidetector CT imaging of the orbits was performed following the bolus administration of intravenous contrast.  Contrast: 75mL OMNIPAQUE IOHEXOL 300 MG/ML  SOLN  Comparison: None.  Findings: Soft tissue swelling is present in the left preseptal periorbital tissues.  The findings suggestive of orbital cellulitis.  There is no stranding of the orbital or intraconal fat.  Superior ophthalmic veins appear patent. Disconjugate gaze incidentally noted.  Visualized intracranial contents appear within normal limits. Paranasal sinuses demonstrates some scattered areas of mild mucosal thickening but no gross opacification or fluid levels.  Mastoid air cells are clear.  Mandibular condyles located.  Pterygoid plates intact.  Right-sided nasal septal spur. Mild enhancement of the subcutaneous fat of the left cheek, again consistent with cellulitis.  There is no underlying facial fracture.  No abscess.  IMPRESSION: Preseptal periorbital  subcutaneous infiltration /soft tissue thickening likely represents periorbital cellulitis.   Original Report Authenticated By: Andreas Newport, M.D.     1. Periorbital cellulitis of left eye       MDM     VSS.  Patient is non-toxic appearing.  Airway is patent.  Localized erythema and STS of the left face including the periorbital area.  She also has scattered maculopapular rash to the left shoulder, bilateral upper legs, and upper left arm.  No vesicles or pustules.  Clinical suspicion for shingles is low.    Patient also seen by EDP and care plan discussed.  Will order CT scan of orbits  CT results reviewed and discussed with patient.  Will treat with IV rocephin and prescriptions for bactrim and keflex and steroid cream for extremity rash which is likely plant dermatitis.    She agrees to return here in 1-2 days for recheck  She appears stable for discharge.     Deshondra Worst L. Trisha Mangle, PA-C 01/12/13 2152

## 2013-01-13 NOTE — ED Provider Notes (Signed)
Medical screening examination/treatment/procedure(s) were conducted as a shared visit with non-physician practitioner(s) and myself.  I personally evaluated the patient during the encounter  L facial redness and eyelid swelling. No fever, no exposures. Denies vision change.  No distress, airway patent. No pain with EOM.  Glynn Octave, MD 01/13/13 [phone]

## 2013-03-15 ENCOUNTER — Emergency Department (HOSPITAL_COMMUNITY)
Admission: EM | Admit: 2013-03-15 | Discharge: 2013-03-15 | Disposition: A | Discharge status: Home / Self Care | Payer: Self-pay | Attending: Emergency Medicine | Admitting: Emergency Medicine

## 2013-03-15 ENCOUNTER — Encounter (HOSPITAL_COMMUNITY): Payer: Self-pay | Admitting: Emergency Medicine

## 2013-03-15 DIAGNOSIS — F1110 Opioid abuse, uncomplicated: Secondary | ICD-10-CM

## 2013-03-15 DIAGNOSIS — Z8659 Personal history of other mental and behavioral disorders: Secondary | ICD-10-CM | POA: Insufficient documentation

## 2013-03-15 DIAGNOSIS — Z87891 Personal history of nicotine dependence: Secondary | ICD-10-CM | POA: Insufficient documentation

## 2013-03-15 DIAGNOSIS — F1111 Opioid abuse, in remission: Secondary | ICD-10-CM

## 2013-03-15 DIAGNOSIS — F1920 Other psychoactive substance dependence, uncomplicated: Secondary | ICD-10-CM | POA: Diagnosis present

## 2013-03-15 DIAGNOSIS — F1910 Other psychoactive substance abuse, uncomplicated: Secondary | ICD-10-CM

## 2013-03-15 DIAGNOSIS — Z880 Allergy status to penicillin: Secondary | ICD-10-CM | POA: Insufficient documentation

## 2013-03-15 DIAGNOSIS — IMO0002 Reserved for concepts with insufficient information to code with codable children: Secondary | ICD-10-CM | POA: Insufficient documentation

## 2013-03-15 DIAGNOSIS — F1120 Opioid dependence, uncomplicated: Secondary | ICD-10-CM | POA: Diagnosis present

## 2013-03-15 HISTORY — DX: Opioid abuse, uncomplicated: F11.10

## 2013-03-15 HISTORY — DX: Opioid dependence, uncomplicated: F11.20

## 2013-03-15 HISTORY — DX: Opioid abuse, in remission: F11.11

## 2013-03-15 MED ORDER — DICYCLOMINE HCL 20 MG PO TABS: 20.0000 mg | ORAL_TABLET | Freq: Two times a day (BID) | ORAL | Status: DC

## 2013-03-15 MED ORDER — CLONIDINE HCL 0.1 MG PO TABS: 0.1000 mg | ORAL_TABLET | Freq: Two times a day (BID) | ORAL | Status: DC

## 2013-03-15 MED ORDER — ONDANSETRON HCL 4 MG PO TABS: 4.0000 mg | ORAL_TABLET | Freq: Four times a day (QID) | ORAL | Status: DC

## 2013-03-15 NOTE — ED Notes (Addendum)
Per pt/family, pt states she OD'ed on heroin this past Sunday-has overdosed in the past-started taking pills since 15 when she got wisdom teeth pulled-states last use Sunday-has smoked pot since

## 2013-03-15 NOTE — Consult Note (Signed)
Patient discussed, agree with plan 

## 2013-03-15 NOTE — Consult Note (Signed)
[facility] Psychiatry Consult   Reason for Consult:  Evaluation for inpatient treatment Referring Physician:  EDP  Nicole Moss is an 22 y.o. female.  Assessment: AXIS I:  Polysubstance Abuse including opiates AXIS II:  Deferred AXIS III:   Past Medical History  Diagnosis Date  . Anorexia    AXIS IV:  economic problems, occupational problems, other psychosocial or environmental problems and problems related to social environment AXIS V:  51-60 moderate symptoms  Plan:  No evidence of imminent risk to self or others at present.   Patient does not meet criteria for psychiatric inpatient admission. Supportive therapy provided about ongoing stressors. Discussed crisis plan, support from social network, calling 911, coming to the Emergency Department, and calling Suicide Hotline. Refer to rehab services  Subjective:   Nicole Moss is a 22 y.o. female.  HPI:  Patient presents to [facility] stating that she wants to detox off of heroin and pain pills.  Patient states that she has been doing pain pills and heroin since she was 73-18 yrs old.  Patient states that he last use was yesterday.  Patient states that she has also been using Suboxone off the street to help her with withdrawal.  Spoke to patient mother and states that this is the first time that patient has said that she wants to quit and get her life together.  Patient denies suicidal ideation, homicidal ideation, psychosis, and paranoia.  HPI Elements:   Location:  [facility] ED. Quality:  Affection patient mentally and physically. Severity:  Daily heroin and pain pill use.  Past Psychiatric History: Past Medical History  Diagnosis Date  . Anorexia     reports that she has quit smoking. She does not have any smokeless tobacco history on file. She reports that she uses illicit drugs. She reports that she does not drink alcohol. No family history on file.         Allergies:   Allergies  Allergen Reactions  . Penicillins  Nausea And Vomiting    Past Psychiatric History: Diagnosis:  Polysubstance abuse/dependence  Hospitalizations:  [facility] 1 1/2 yr ago  Outpatient Care:  No  Substance Abuse Care:  Not at this time  Self-Mutilation:  No  Suicidal Attempts:  No  Violent Behaviors:  No   Objective: Blood pressure 115/74, pulse 96, temperature 98.5 F (36.9 C), temperature source Oral, resp. rate 18, last menstrual period 03/12/2013, SpO2 100.00%.There is no weight on file to calculate BMI.No results found for this or any previous visit (from the past 72 hour(s)).Marland Kitchen  No current facility-administered medications for this encounter.   Current Outpatient Prescriptions  Medication Sig Dispense Refill  . ibuprofen (ADVIL,MOTRIN) 200 MG tablet Take 200 mg by mouth every 6 (six) hours as needed for pain.        Psychiatric Specialty Exam:     Blood pressure 115/74, pulse 96, temperature 98.5 F (36.9 C), temperature source Oral, resp. rate 18, last menstrual period 03/12/2013, SpO2 100.00%.There is no weight on file to calculate BMI.  General Appearance: Casual and Disheveled  Eye Contact::  Fair  Speech:  Clear and Coherent and Normal Rate  Volume:  Normal  Mood:  Anxious  Affect:  Constricted  Thought Process:  Circumstantial and Goal Directed  Orientation:  Full (Time, Place, and Person)  Thought Content:  WDL  Suicidal Thoughts:  No  Homicidal Thoughts:  No  Memory:  Immediate;   Good Recent;   Good Remote;   Good  Judgement:  Fair  Insight:  Fair and Present  Psychomotor Activity:  Normal  Concentration:  Fair  Recall:  Good  Akathisia:  No  Handed:  Right  AIMS (if indicated):     Assets:  Desire for Improvement Social Support  Sleep:      Treatment Plan Summary: Recommendation/Disposition:  May discharge patient home with rehab/detox resources ([facility]).    Assunta Found, FNP-BC 03/15/2013 2:56 PM

## 2013-03-15 NOTE — Progress Notes (Signed)
[facility] provided patient with a [facility] Orange Card application, highlighting [facility].  °

## 2013-03-24 NOTE — ED Provider Notes (Signed)
  CSN: [id number]     Arrival date & time 03/15/13  1243 History     First MD Initiated Contact with Patient 03/15/13 1246     21yF with opiate abuse. Requesting detox/rehab. User since age of 74. Unintentional od Sunday. Last used yesterday. Feels anxious and mild nausea. No si/hi. No hallucinations. No other complaints.   Chief Complaint  Patient presents with  . Medical Clearance   (Consider location/radiation/quality/duration/timing/severity/associated sxs/prior Treatment) HPI  Past Medical History  Diagnosis Date  . Anorexia    Past Surgical History  Procedure Laterality Date  . Wisdom tooth extraction     No family history on file. History  Substance Use Topics  . Smoking status: Former Games developer  . Smokeless tobacco: Not on file  . Alcohol Use: No   OB History   Grav Para Term Preterm Abortions TAB SAB Ect Mult Living                 Review of Systems  All systems reviewed and negative, other than as noted in HPI.   Allergies  Penicillins  Home Medications   Current Outpatient Rx  Name  Route  Sig  Dispense  Refill  . ibuprofen (ADVIL,MOTRIN) 200 MG tablet   Oral   Take 200 mg by mouth every 6 (six) hours as needed for pain.         . cloNIDine (CATAPRES) 0.1 MG tablet   Oral   Take 1 tablet (0.1 mg total) by mouth 2 (two) times daily.   10 tablet   0   . dicyclomine (BENTYL) 20 MG tablet   Oral   Take 1 tablet (20 mg total) by mouth 2 (two) times daily.   20 tablet   0   . ondansetron (ZOFRAN) 4 MG tablet   Oral   Take 1 tablet (4 mg total) by mouth every 6 (six) hours.   12 tablet   0    BP 94/54  Pulse 61  Temp(Src) 98.5 F (36.9 C) (Oral)  Resp 18  SpO2 100%  LMP 03/12/2013 Physical Exam  Nursing note and vitals reviewed. Constitutional: She is oriented to person, place, and time. She appears well-developed and well-nourished. No distress.  HENT:  Head: Normocephalic and atraumatic.  Eyes: Conjunctivae are normal. Right eye  exhibits no discharge. Left eye exhibits no discharge.  Neck: Neck supple.  Cardiovascular: Normal rate, regular rhythm and normal heart sounds.  Exam reveals no gallop and no friction rub.   No murmur heard. Pulmonary/Chest: Effort normal and breath sounds normal. No respiratory distress.  Abdominal: Soft. She exhibits no distension. There is no tenderness.  Musculoskeletal: She exhibits no edema and no tenderness.  Neurological: She is alert and oriented to person, place, and time. No cranial nerve deficit. She exhibits normal muscle tone. Coordination normal.  Skin: Skin is warm and dry.  Psychiatric: She has a normal mood and affect. Her behavior is normal. Thought content normal.    ED Course   Procedures (including critical care time)  Labs Reviewed - No data to display No results found. 1. Polysubstance dependence including opioid type drug, episodic abuse   2. Heroin abuse   3. Opioid abuse   4. Opiate abuse, continuous     MDM  21yF with opiate abuse. No SI/HI/psychosis. Meds. Outpt FU.   Raeford Razor, MD 03/24/13 [phone]

## 2015-03-04 ENCOUNTER — Encounter (HOSPITAL_COMMUNITY): Payer: Self-pay | Admitting: *Deleted

## 2015-03-04 ENCOUNTER — Inpatient Hospital Stay (HOSPITAL_COMMUNITY)
Admission: EM | Admit: 2015-03-04 | Discharge: 2015-03-06 | DRG: 552 | Disposition: A | Discharge status: Home / Self Care | Payer: Self-pay | Attending: Surgery | Admitting: Surgery

## 2015-03-04 ENCOUNTER — Emergency Department (HOSPITAL_COMMUNITY): Payer: Self-pay

## 2015-03-04 DIAGNOSIS — T1490XA Injury, unspecified, initial encounter: Secondary | ICD-10-CM

## 2015-03-04 DIAGNOSIS — S022XXA Fracture of nasal bones, initial encounter for closed fracture: Secondary | ICD-10-CM | POA: Diagnosis present

## 2015-03-04 DIAGNOSIS — F1120 Opioid dependence, uncomplicated: Secondary | ICD-10-CM | POA: Diagnosis present

## 2015-03-04 DIAGNOSIS — S022XXB Fracture of nasal bones, initial encounter for open fracture: Secondary | ICD-10-CM | POA: Diagnosis present

## 2015-03-04 DIAGNOSIS — Z681 Body mass index (BMI) 19 or less, adult: Secondary | ICD-10-CM

## 2015-03-04 DIAGNOSIS — S22009A Unspecified fracture of unspecified thoracic vertebra, initial encounter for closed fracture: Secondary | ICD-10-CM | POA: Diagnosis present

## 2015-03-04 DIAGNOSIS — M79671 Pain in right foot: Secondary | ICD-10-CM

## 2015-03-04 DIAGNOSIS — Z880 Allergy status to penicillin: Secondary | ICD-10-CM

## 2015-03-04 DIAGNOSIS — S0121XA Laceration without foreign body of nose, initial encounter: Secondary | ICD-10-CM | POA: Diagnosis present

## 2015-03-04 DIAGNOSIS — S22089A Unspecified fracture of T11-T12 vertebra, initial encounter for closed fracture: Principal | ICD-10-CM | POA: Diagnosis present

## 2015-03-04 DIAGNOSIS — R630 Anorexia: Secondary | ICD-10-CM | POA: Diagnosis present

## 2015-03-04 DIAGNOSIS — Z87891 Personal history of nicotine dependence: Secondary | ICD-10-CM

## 2015-03-04 LAB — URINALYSIS, ROUTINE W REFLEX MICROSCOPIC
BILIRUBIN URINE: NEGATIVE
Glucose, UA: NEGATIVE mg/dL
KETONES UR: NEGATIVE mg/dL
LEUKOCYTES UA: NEGATIVE
Nitrite: NEGATIVE
PH: 6 (ref 5.0–8.0)
Protein, ur: 100 mg/dL — AB
SPECIFIC GRAVITY, URINE: 1.025 (ref 1.005–1.030)
UROBILINOGEN UA: 0.2 mg/dL (ref 0.0–1.0)

## 2015-03-04 LAB — RAPID URINE DRUG SCREEN, HOSP PERFORMED
AMPHETAMINES: NOT DETECTED
BENZODIAZEPINES: NOT DETECTED
Barbiturates: NOT DETECTED
COCAINE: NOT DETECTED
Opiates: POSITIVE — AB
TETRAHYDROCANNABINOL: POSITIVE — AB

## 2015-03-04 LAB — I-STAT CHEM 8, ED
BUN: 14 mg/dL (ref 6–20)
CALCIUM ION: 1.01 mmol/L — AB (ref 1.12–1.23)
CREATININE: 0.7 mg/dL (ref 0.44–1.00)
Chloride: 107 mmol/L (ref 101–111)
Glucose, Bld: 171 mg/dL — ABNORMAL HIGH (ref 65–99)
HEMATOCRIT: 45 % (ref 36.0–46.0)
HEMOGLOBIN: 15.3 g/dL — AB (ref 12.0–15.0)
POTASSIUM: 4.1 mmol/L (ref 3.5–5.1)
Sodium: 137 mmol/L (ref 135–145)
TCO2: 21 mmol/L (ref 0–100)

## 2015-03-04 LAB — COMPREHENSIVE METABOLIC PANEL
ALBUMIN: 4.4 g/dL (ref 3.5–5.0)
ALT: 203 U/L — AB (ref 14–54)
ANION GAP: 13 (ref 5–15)
AST: 161 U/L — AB (ref 15–41)
Alkaline Phosphatase: 97 U/L (ref 38–126)
BUN: 11 mg/dL (ref 6–20)
CO2: 22 mmol/L (ref 22–32)
Calcium: 9.7 mg/dL (ref 8.9–10.3)
Chloride: 102 mmol/L (ref 101–111)
Creatinine, Ser: 0.89 mg/dL (ref 0.44–1.00)
GFR calc Af Amer: >60 mL/min (ref >60)
Glucose, Bld: 167 mg/dL — ABNORMAL HIGH (ref 65–99)
POTASSIUM: 4.3 mmol/L (ref 3.5–5.1)
SODIUM: 137 mmol/L (ref 135–145)
TOTAL PROTEIN: 7.7 g/dL (ref 6.5–8.1)
Total Bilirubin: 1.1 mg/dL (ref 0.3–1.2)

## 2015-03-04 LAB — CBC
HCT: 42.6 % (ref 36.0–46.0)
HEMOGLOBIN: 14.8 g/dL (ref 12.0–15.0)
MCH: 30.4 pg (ref 26.0–34.0)
MCHC: 34.7 g/dL (ref 30.0–36.0)
MCV: 87.5 fL (ref 78.0–100.0)
PLATELETS: 254 10*3/uL (ref 150–400)
RBC: 4.87 MIL/uL (ref 3.87–5.11)
RDW: 13.6 % (ref 11.5–15.5)
WBC: 15.6 10*3/uL — AB (ref 4.0–10.5)

## 2015-03-04 LAB — CDS SEROLOGY

## 2015-03-04 LAB — URINE MICROSCOPIC-ADD ON

## 2015-03-04 LAB — I-STAT BETA HCG BLOOD, ED (MC, WL, AP ONLY): I-stat hCG, quantitative: <5 m[IU]/mL (ref <5)

## 2015-03-04 LAB — PROTIME-INR
INR: 1.11 (ref 0.00–1.49)
PROTHROMBIN TIME: 14.5 s (ref 11.6–15.2)

## 2015-03-04 LAB — ETHANOL

## 2015-03-04 LAB — SAMPLE TO BLOOD BANK

## 2015-03-04 MED ORDER — IOHEXOL 300 MG/ML  SOLN
100.0000 mL | Freq: Once | INTRAMUSCULAR | Status: AC | PRN
  Administered 2015-03-04: 100 mL via INTRAVENOUS

## 2015-03-04 MED ORDER — LIDOCAINE-EPINEPHRINE 1 %-1:100000 IJ SOLN
20.0000 mL | Freq: Once | INTRAMUSCULAR | Status: AC
  Administered 2015-03-04: 20 mL via INTRADERMAL
  Filled 2015-03-04: qty 1

## 2015-03-04 MED ORDER — POTASSIUM CHLORIDE IN NACL 20-0.9 MEQ/L-% IV SOLN
INTRAVENOUS | Status: DC
  Administered 2015-03-04 – 2015-03-06 (×2): via INTRAVENOUS
  Filled 2015-03-04 (×3): qty 1000

## 2015-03-04 MED ORDER — DOCUSATE SODIUM 100 MG PO CAPS
100.0000 mg | ORAL_CAPSULE | Freq: Two times a day (BID) | ORAL | Status: DC
  Administered 2015-03-04 – 2015-03-06 (×4): 100 mg via ORAL
  Filled 2015-03-04 (×5): qty 1

## 2015-03-04 MED ORDER — HYDROMORPHONE HCL 1 MG/ML IJ SOLN
1.0000 mg | INTRAMUSCULAR | Status: DC | PRN
  Administered 2015-03-04 – 2015-03-05 (×2): 1 mg via INTRAVENOUS
  Filled 2015-03-04 (×3): qty 1

## 2015-03-04 MED ORDER — PANTOPRAZOLE SODIUM 40 MG IV SOLR
40.0000 mg | Freq: Every day | INTRAVENOUS | Status: DC
  Administered 2015-03-05: 40 mg via INTRAVENOUS
  Filled 2015-03-04: qty 40

## 2015-03-04 MED ORDER — HYDROMORPHONE HCL 1 MG/ML IJ SOLN
1.0000 mg | Freq: Once | INTRAMUSCULAR | Status: AC
  Administered 2015-03-04: 1 mg via INTRAVENOUS
  Filled 2015-03-04: qty 1

## 2015-03-04 MED ORDER — ONDANSETRON HCL 4 MG/2ML IJ SOLN
4.0000 mg | Freq: Once | INTRAMUSCULAR | Status: AC
  Administered 2015-03-04: 4 mg via INTRAVENOUS
  Filled 2015-03-04: qty 2

## 2015-03-04 MED ORDER — ONDANSETRON HCL 4 MG/2ML IJ SOLN: 4.0000 mg | Freq: Four times a day (QID) | INTRAMUSCULAR | Status: DC | PRN

## 2015-03-04 MED ORDER — TETANUS-DIPHTH-ACELL PERTUSSIS 5-2.5-18.5 LF-MCG/0.5 IM SUSP
0.5000 mL | Freq: Once | INTRAMUSCULAR | Status: DC
  Filled 2015-03-04: qty 0.5

## 2015-03-04 MED ORDER — PANTOPRAZOLE SODIUM 40 MG PO TBEC
40.0000 mg | DELAYED_RELEASE_TABLET | Freq: Every day | ORAL | Status: DC
  Administered 2015-03-06: 40 mg via ORAL
  Filled 2015-03-04: qty 1

## 2015-03-04 MED ORDER — FENTANYL CITRATE (PF) 100 MCG/2ML IJ SOLN
50.0000 ug | Freq: Once | INTRAMUSCULAR | Status: AC
  Administered 2015-03-04: 50 ug via INTRAVENOUS
  Filled 2015-03-04: qty 2

## 2015-03-04 MED ORDER — BISACODYL 10 MG RE SUPP: 10.0000 mg | Freq: Every day | RECTAL | Status: DC | PRN

## 2015-03-04 MED ORDER — ONDANSETRON HCL 4 MG PO TABS: 4.0000 mg | ORAL_TABLET | Freq: Four times a day (QID) | ORAL | Status: DC | PRN
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
## 2015-03-05 ENCOUNTER — Inpatient Hospital Stay (HOSPITAL_COMMUNITY): Payer: Self-pay

## 2015-03-05 LAB — COMPREHENSIVE METABOLIC PANEL
ALBUMIN: 3.8 g/dL (ref 3.5–5.0)
ALT: 165 U/L — ABNORMAL HIGH (ref 14–54)
AST: 117 U/L — AB (ref 15–41)
Alkaline Phosphatase: 79 U/L (ref 38–126)
Anion gap: 9 (ref 5–15)
BILIRUBIN TOTAL: 1 mg/dL (ref 0.3–1.2)
BUN: 10 mg/dL (ref 6–20)
CALCIUM: 9.1 mg/dL (ref 8.9–10.3)
CHLORIDE: 105 mmol/L (ref 101–111)
CO2: 26 mmol/L (ref 22–32)
Creatinine, Ser: 0.69 mg/dL (ref 0.44–1.00)
GFR calc Af Amer: >60 mL/min (ref >60)
GFR calc non Af Amer: >60 mL/min (ref >60)
GLUCOSE: 96 mg/dL (ref 65–99)
Potassium: 3.6 mmol/L (ref 3.5–5.1)
Sodium: 140 mmol/L (ref 135–145)
Total Protein: 6.3 g/dL — ABNORMAL LOW (ref 6.5–8.1)

## 2015-03-05 LAB — CBC
HCT: 37.2 % (ref 36.0–46.0)
HEMOGLOBIN: 12.8 g/dL (ref 12.0–15.0)
MCH: 30 pg (ref 26.0–34.0)
MCHC: 34.4 g/dL (ref 30.0–36.0)
MCV: 87.3 fL (ref 78.0–100.0)
Platelets: 239 10*3/uL (ref 150–400)
RBC: 4.26 MIL/uL (ref 3.87–5.11)
RDW: 13.7 % (ref 11.5–15.5)
WBC: 13.9 10*3/uL — AB (ref 4.0–10.5)

## 2015-03-05 LAB — MRSA PCR SCREENING: MRSA BY PCR: NEGATIVE

## 2015-03-05 MED ORDER — SODIUM CHLORIDE 0.9 % IV BOLUS (SEPSIS)
1000.0000 mL | Freq: Once | INTRAVENOUS | Status: AC
  Administered 2015-03-05: 1000 mL via INTRAVENOUS

## 2015-03-05 MED ORDER — BACITRACIN ZINC 500 UNIT/GM EX OINT
TOPICAL_OINTMENT | Freq: Two times a day (BID) | CUTANEOUS | Status: DC
  Administered 2015-03-05: 1 via TOPICAL
  Administered 2015-03-05 – 2015-03-06 (×2): via TOPICAL
  Filled 2015-03-05: qty 28.35

## 2015-03-05 MED ORDER — CIPROFLOXACIN HCL 500 MG PO TABS
500.0000 mg | ORAL_TABLET | Freq: Two times a day (BID) | ORAL | Status: DC
  Administered 2015-03-05 – 2015-03-06 (×3): 500 mg via ORAL
  Filled 2015-03-05 (×5): qty 1

## 2015-03-05 MED ORDER — OXYCODONE HCL 5 MG PO TABS
10.0000 mg | ORAL_TABLET | ORAL | Status: DC | PRN
  Administered 2015-03-05 – 2015-03-06 (×5): 10 mg via ORAL
  Filled 2015-03-05 (×5): qty 2

## 2015-03-05 MED ORDER — HYDROMORPHONE HCL 1 MG/ML IJ SOLN
1.0000 mg | INTRAMUSCULAR | Status: DC | PRN
  Administered 2015-03-05 – 2015-03-06 (×2): 1 mg via INTRAVENOUS
  Filled 2015-03-05 (×2): qty 1

## 2015-03-05 MED ORDER — SALINE SPRAY 0.65 % NA SOLN
4.0000 | NASAL | Status: DC | PRN
  Filled 2015-03-05: qty 44

## 2015-03-05 MED ORDER — NICOTINE 21 MG/24HR TD PT24
21.0000 mg | MEDICATED_PATCH | Freq: Every day | TRANSDERMAL | Status: DC
  Administered 2015-03-05 – 2015-03-06 (×2): 21 mg via TRANSDERMAL
  Filled 2015-03-05 (×2): qty 1
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
## 2015-03-05 NOTE — Progress Notes (Signed)
Orthopedic Tech Progress Note Patient Details:  Nicole Moss [birth year] [id number] Brace order completed by bio-tech vendor. Patient ID: Nicole Moss, female   DOB: Aug 02, 1991, 24 y.o.   MRN: [id number]   Nicole Moss 03/05/2015, 6:59 PM

## 2015-03-05 NOTE — Progress Notes (Signed)
Orthopedic Tech Progress Note Patient Details:  Nicole Moss [birth year] [id number]  Patient ID: [name], female   DOB: July 10, 1991, 24 y.o.   MRN: [id number] Called in bio-tech brace order; spoke with Anderson Malta, Berline Semrad 03/05/2015, 1:11 PM
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
## 2015-03-05 NOTE — Progress Notes (Signed)
Patient arrived to 1N03. Patient is alert and oriented x4, cognition appropriate, states pain is a "5/10" in lower back, brace applied, ice pack applied to right leg per patient request, laceration on nose is clean, dry, and intact, patient states she has been voiding throughout day, last time right before transfer to unit. She states she has been passing flatus. Patient's right leg is 3/5 strength, other extremities are 5/5, denies any numbness or tingling in upper and lower bilateral extremities. Patient states this is due to pain. Patient was oriented to room, floor, unit, staff. Will continue to monitor closely.

## 2015-03-06 DIAGNOSIS — S022XXB Fracture of nasal bones, initial encounter for open fracture: Secondary | ICD-10-CM | POA: Diagnosis present

## 2015-03-06 MED ORDER — OXYCODONE-ACETAMINOPHEN 5-325 MG PO TABS: 1.0000 | ORAL_TABLET | ORAL | Status: DC | PRN

## 2015-03-06 MED ORDER — CIPROFLOXACIN HCL 500 MG PO TABS: 500.0000 mg | ORAL_TABLET | Freq: Two times a day (BID) | ORAL | Status: DC
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
## 2015-03-06 NOTE — Care Management Note (Signed)
Case Management Note  Patient Details  Name:Nicole Moss MRN: [id number] Date of Birth: February 08, 1991  Subjective/Objective:                    Action/Plan: MATCH letter given and explained . Patient and visitor voiced understanding.   Expected Discharge Date:                  Expected Discharge Plan:     In-House Referral:     Discharge planning Services  MATCH Program, Medication Assistance  Post Acute Care Choice:    Choice offered to:     DME Arranged:    DME Agency:     HH Arranged:    HH Agency:     Status of Service:  Completed, signed off  Medicare Important Message Given:    Date Medicare IM Given:    Medicare IM give by:    Date Additional Medicare IM Given:    Additional Medicare Important Message give by:     If discussed at Long Length of Stay Meetings, dates discussed:    Additional Comments:  Kingsley Plan, RN 03/06/2015, 10:06 AM
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
## 2015-03-06 NOTE — Progress Notes (Signed)
Patient is discharged from room 4N03 at this time. Alert and in stable condition. IV site d/c'd. Instructions read to patient and understanding verbalized. Left unit via wheelchair with all belongings and mother at side.
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
## 2015-03-06 NOTE — Progress Notes (Signed)
Physical Therapy Discharge Patient Details Name: Roxana Lai MRN: [id number] DOB: 1990/08/04 Today's Date: 03/06/2015 Time: 5828-3323 PT Time Calculation (min) (ACUTE ONLY): 24 min  Patient discharged from PT services secondary to goals met and no further PT needs identified.  Please see latest therapy progress note for current level of functioning and progress toward goals.    Progress and discharge plan discussed with patient and/or caregiver: Patient/Caregiver agrees with plan  GP     Duncan Dull 03/06/2015, 9:41 AM
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
## 2015-03-07 LAB — URINE CULTURE
Culture: >=100000
SPECIAL REQUESTS: NORMAL

## 2015-05-03 ENCOUNTER — Encounter: Payer: Self-pay | Admitting: Emergency Medicine

## 2015-05-03 ENCOUNTER — Emergency Department
Admission: EM | Admit: 2015-05-03 | Discharge: 2015-05-03 | Disposition: A | Discharge status: Home / Self Care | Payer: Self-pay | Attending: Emergency Medicine | Admitting: Emergency Medicine

## 2015-05-03 DIAGNOSIS — N390 Urinary tract infection, site not specified: Secondary | ICD-10-CM | POA: Insufficient documentation

## 2015-05-03 DIAGNOSIS — Z87891 Personal history of nicotine dependence: Secondary | ICD-10-CM | POA: Insufficient documentation

## 2015-05-03 DIAGNOSIS — Z79899 Other long term (current) drug therapy: Secondary | ICD-10-CM | POA: Insufficient documentation

## 2015-05-03 DIAGNOSIS — Z3202 Encounter for pregnancy test, result negative: Secondary | ICD-10-CM | POA: Insufficient documentation

## 2015-05-03 DIAGNOSIS — R112 Nausea with vomiting, unspecified: Secondary | ICD-10-CM | POA: Insufficient documentation

## 2015-05-03 DIAGNOSIS — Z880 Allergy status to penicillin: Secondary | ICD-10-CM | POA: Insufficient documentation

## 2015-05-03 HISTORY — DX: Other psychoactive substance dependence, uncomplicated: F19.20

## 2015-05-03 LAB — CBC
HEMATOCRIT: 42.6 % (ref 35.0–47.0)
HEMOGLOBIN: 14.4 g/dL (ref 12.0–16.0)
MCH: 30 pg (ref 26.0–34.0)
MCHC: 33.9 g/dL (ref 32.0–36.0)
MCV: 88.4 fL (ref 80.0–100.0)
Platelets: 222 10*3/uL (ref 150–440)
RBC: 4.82 MIL/uL (ref 3.80–5.20)
RDW: 13 % (ref 11.5–14.5)
WBC: 7.5 10*3/uL (ref 3.6–11.0)

## 2015-05-03 LAB — COMPREHENSIVE METABOLIC PANEL
ALT: 82 U/L — ABNORMAL HIGH (ref 14–54)
ANION GAP: 7 (ref 5–15)
AST: 73 U/L — ABNORMAL HIGH (ref 15–41)
Albumin: 4.6 g/dL (ref 3.5–5.0)
Alkaline Phosphatase: 77 U/L (ref 38–126)
BUN: 7 mg/dL (ref 6–20)
CHLORIDE: 105 mmol/L (ref 101–111)
CO2: 25 mmol/L (ref 22–32)
Calcium: 9.6 mg/dL (ref 8.9–10.3)
Creatinine, Ser: 0.73 mg/dL (ref 0.44–1.00)
Glucose, Bld: 123 mg/dL — ABNORMAL HIGH (ref 65–99)
POTASSIUM: 4.1 mmol/L (ref 3.5–5.1)
SODIUM: 137 mmol/L (ref 135–145)
Total Bilirubin: 0.8 mg/dL (ref 0.3–1.2)
Total Protein: 7.8 g/dL (ref 6.5–8.1)

## 2015-05-03 LAB — URINALYSIS COMPLETE WITH MICROSCOPIC (ARMC ONLY)
Glucose, UA: NEGATIVE mg/dL
HGB URINE DIPSTICK: NEGATIVE
Ketones, ur: NEGATIVE mg/dL
Nitrite: POSITIVE — AB
PH: 7 (ref 5.0–8.0)
Protein, ur: 30 mg/dL — AB
Specific Gravity, Urine: 1.019 (ref 1.005–1.030)

## 2015-05-03 LAB — POCT PREGNANCY, URINE: Preg Test, Ur: NEGATIVE

## 2015-05-03 LAB — LIPASE, BLOOD: LIPASE: 18 U/L — AB (ref 22–51)

## 2015-05-03 MED ORDER — CIPROFLOXACIN IN D5W 400 MG/200ML IV SOLN
400.0000 mg | Freq: Once | INTRAVENOUS | Status: AC
Start: 2015-05-03 — End: 2015-05-03
  Administered 2015-05-03: 400 mg via INTRAVENOUS
  Filled 2015-05-03: qty 200

## 2015-05-03 MED ORDER — CIPROFLOXACIN HCL 500 MG PO TABS: 500.0000 mg | ORAL_TABLET | Freq: Two times a day (BID) | ORAL | Status: DC

## 2015-05-03 MED ORDER — ONDANSETRON 4 MG PO TBDP: 4.0000 mg | ORAL_TABLET | Freq: Three times a day (TID) | ORAL | Status: DC | PRN

## 2015-05-03 MED ORDER — ONDANSETRON HCL 4 MG/2ML IJ SOLN
4.0000 mg | Freq: Once | INTRAMUSCULAR | Status: AC
  Administered 2015-05-03: 4 mg via INTRAVENOUS
  Filled 2015-05-03: qty 2

## 2015-05-03 MED ORDER — CIPROFLOXACIN HCL 500 MG PO TABS
500.0000 mg | ORAL_TABLET | Freq: Once | ORAL | Status: AC
  Administered 2015-05-03: 500 mg via ORAL

## 2015-05-03 MED ORDER — CIPROFLOXACIN HCL 500 MG PO TABS
ORAL_TABLET | ORAL | Status: AC
  Administered 2015-05-03: 500 mg via ORAL
  Filled 2015-05-03: qty 1

## 2015-05-03 MED ORDER — SODIUM CHLORIDE 0.9 % IV BOLUS (SEPSIS)
1000.0000 mL | Freq: Once | INTRAVENOUS | Status: AC
  Administered 2015-05-03: 1000 mL via INTRAVENOUS

## 2015-05-03 MED ORDER — ONDANSETRON HCL 4 MG/2ML IJ SOLN
4.0000 mg | Freq: Once | INTRAMUSCULAR | Status: AC
Start: 2015-05-03 — End: 2015-05-03
  Administered 2015-05-03: 4 mg via INTRAVENOUS
  Filled 2015-05-03: qty 2
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
## 2015-05-03 NOTE — ED Notes (Signed)
Pt to ed with c/o feeling nauseated today at [facility].  Pt states she has only been there one day for detox and rehab for heroin and alcohol.

## 2016-02-06 ENCOUNTER — Encounter (HOSPITAL_COMMUNITY): Payer: Self-pay | Admitting: Emergency Medicine

## 2016-02-06 ENCOUNTER — Emergency Department (HOSPITAL_COMMUNITY)
Admission: EM | Admit: 2016-02-06 | Discharge: 2016-02-06 | Disposition: A | Discharge status: Home / Self Care | Payer: No Typology Code available for payment source | Attending: Emergency Medicine | Admitting: Emergency Medicine

## 2016-02-06 DIAGNOSIS — K0889 Other specified disorders of teeth and supporting structures: Secondary | ICD-10-CM | POA: Insufficient documentation

## 2016-02-06 DIAGNOSIS — Z79899 Other long term (current) drug therapy: Secondary | ICD-10-CM | POA: Insufficient documentation

## 2016-02-06 DIAGNOSIS — Z87891 Personal history of nicotine dependence: Secondary | ICD-10-CM | POA: Insufficient documentation

## 2016-02-06 MED ORDER — CLINDAMYCIN HCL 150 MG PO CAPS: 300.0000 mg | ORAL_CAPSULE | Freq: Three times a day (TID) | ORAL | Status: DC

## 2016-02-06 MED ORDER — ACIDOPHILUS PROBIOTIC 10 MG PO TABS: 10.0000 mg | ORAL_TABLET | Freq: Three times a day (TID) | ORAL | Status: DC

## 2016-02-06 MED ORDER — NAPROXEN 250 MG PO TABS: 250.0000 mg | ORAL_TABLET | Freq: Two times a day (BID) | ORAL | Status: DC

## 2016-02-06 NOTE — ED Provider Notes (Signed)
CSN: [id number]     Arrival date & time 02/06/16  1949 History  By signing my name below, I, Soijett Blue, attest that this documentation has been prepared under the direction and in the presence of Will Issai Werling, PA-C Electronically Signed: Soijett Blue, ED Scribe. 02/06/2016. 9:23 PM.   Chief Complaint  Patient presents with  . Dental Pain     The history is provided by the patient. No language interpreter was used.    Nicole Moss is a 25 y.o. female who presents to the Emergency Department complaining of 7/10, left upper dental pain onset 2 weeks. Pt notes that she has two affected teeth and they are both fractured. Denies having a dentist at this time. She states that she is having associated symptoms of bleeding from affected area. She states that she has tried old abx Rx with no relief for her symptoms. She denies abdominal pain, sore throat, trouble swallowing, discharge from mouth, neck stiffness, double vision, fever, chills, and any other symptoms. Pt is allergic to penicillin and sulfa antibiotics.    Past Medical History  Diagnosis Date  . Anorexia   . Addiction to drug [facility](HCC)    Past Surgical History  Procedure Laterality Date  . Wisdom tooth extraction     No family history on file. Social History  Substance Use Topics  . Smoking status: Former Games developermoker  . Smokeless tobacco: None  . Alcohol Use: No   OB History    Gravida Para Term Preterm AB TAB SAB Ectopic Multiple Living   0 0 0 0 0 0 0 0 0 0      Review of Systems  Constitutional: Negative for fever and chills.  HENT: Positive for dental problem (left upper). Negative for sore throat and trouble swallowing.        Bleeding from affected dental area.  Eyes: Negative for visual disturbance.  Respiratory: Negative for shortness of breath.   Gastrointestinal: Negative for abdominal pain.  Musculoskeletal: Negative for neck stiffness.      Allergies  Penicillins and Sulfa antibiotics  Home Medications    Prior to Admission medications   Medication Sig Start Date End Date Taking? Authorizing Provider  bismuth subsalicylate (PEPTO BISMOL) 262 MG/15ML suspension Take 30 mLs by mouth every 6 (six) hours as needed for indigestion or diarrhea or loose stools.    Historical Provider, MD  ciprofloxacin (CIPRO) 500 MG tablet Take 1 tablet (500 mg total) by mouth 2 (two) times daily. 05/03/15   Tommi Rumpshonda L Summers, PA-C  clindamycin (CLEOCIN) 150 MG capsule Take 2 capsules (300 mg total) by mouth 3 (three) times daily. May dispense as 150mg  capsules 02/06/16   Everlene FarrierWilliam Litisha Guagliardo, PA-C  cloNIDine (CATAPRES) 0.1 MG tablet Take 1 tablet (0.1 mg total) by mouth 2 (two) times daily. Patient not taking: Reported on 03/04/2015 03/15/13   Raeford RazorStephen Kohut, MD  dicyclomine (BENTYL) 20 MG tablet Take 1 tablet (20 mg total) by mouth 2 (two) times daily. Patient not taking: Reported on 03/04/2015 03/15/13   Raeford RazorStephen Kohut, MD  Lactobacillus (ACIDOPHILUS PROBIOTIC) 10 MG TABS Take 10 mg by mouth 3 (three) times daily. 02/06/16   Everlene FarrierWilliam Anushree Dorsi, PA-C  naproxen (NAPROSYN) 250 MG tablet Take 1 tablet (250 mg total) by mouth 2 (two) times daily with a meal. 02/06/16   Everlene FarrierWilliam Rik Wadel, PA-C  ondansetron (ZOFRAN ODT) 4 MG disintegrating tablet Take 1 tablet (4 mg total) by mouth every 8 (eight) hours as needed for nausea or vomiting. 05/03/15   Rhonda L  Levada Schilling, PA-C  oxyCODONE-acetaminophen (ROXICET) 5-325 MG per tablet Take 1-2 tablets by mouth every 4 (four) hours as needed (Pain). 03/06/15   Freeman Caldron, PA-C   BP 139/82 mmHg  Pulse 108  Temp(Src) 98.5 F (36.9 C) (Oral)  Resp 16  Ht  (1.727 m)  Wt 45.36 kg  BMI 15.21 kg/m2  SpO2 99% Physical Exam  Constitutional: She appears well-developed and well-nourished. No distress.  Non-toxic appearing.   HENT:  Head: Normocephalic and atraumatic.  Right Ear: External ear normal.  Left Ear: External ear normal.  Mouth/Throat: Oropharynx is clear and moist. No trismus in the jaw. No  dental abscesses. No oropharyngeal exudate.  Tenderness to left upper molar with a cracked tooth.  No discharge from the mouth. No abscess. No facial swelling. No trismus. Uvula is midline without edema. Soft palate rises symmetrically. No tonsillar hypertrophy or exudates. Tongue protrusion is normal.    Eyes: Conjunctivae are normal. Pupils are equal, round, and reactive to light. Right eye exhibits no discharge. Left eye exhibits no discharge.  Neck: Normal range of motion. Neck supple. No JVD present. No tracheal deviation present.  Cardiovascular: Normal rate, regular rhythm, normal heart sounds and intact distal pulses.   Heart rate 88  Pulmonary/Chest: Effort normal and breath sounds normal. No respiratory distress.  Lymphadenopathy:    She has no cervical adenopathy.  Neurological: Coordination normal.  Skin: No rash noted. She is not diaphoretic.  Psychiatric: She has a normal mood and affect. Her behavior is normal.  Nursing note and vitals reviewed.   ED Course  Procedures (including critical care time) DIAGNOSTIC STUDIES: Oxygen Saturation is 99% on RA, nl by my interpretation.    COORDINATION OF CARE: 9:16 PM Discussed treatment plan with pt at bedside which includes abx Rx, naprosyn Rx, and referral to dentist, and pt agreed to plan.    MDM   Meds given in ED:  Medications - No data to display  New Prescriptions   CLINDAMYCIN (CLEOCIN) 150 MG CAPSULE    Take 2 capsules (300 mg total) by mouth 3 (three) times daily. May dispense as  capsules   LACTOBACILLUS (ACIDOPHILUS PROBIOTIC) 10 MG TABS    Take 10 mg by mouth 3 (three) times daily.   NAPROXEN (NAPROSYN) 250 MG TABLET    Take 1 tablet (250 mg total) by mouth 2 (two) times daily with a meal.    Final diagnoses:  Pain, dental   This is a 25 y.o. female who presents to the Emergency Department complaining of 7/10, left upper dental pain onset 2 weeks. Pt notes that she has two affected teeth and they are  both fractured. Denies having a dentist at this time. She states that she is having associated symptoms of bleeding from affected area. She states that she has tried old abx Rx with no relief for her symptoms. Patient with dentalgia.  No abscess requiring immediate incision and drainage.  Exam not concerning for Ludwig's angina or pharyngeal abscess.  Will treat with clindamycin, naprosyn, and lactobacillus Rx. Pt instructed to follow-up with dentist Dr. Michiel Sites.  Discussed return precautions. Pt safe for discharge. I advised the patient to follow-up with their primary care provider this week. I advised the patient to return to the emergency department with new or worsening symptoms or new concerns. The patient verbalized understanding and agreement with plan.    I personally performed the services described in this documentation, which was scribed in my presence. The recorded information has  been reviewed and is accurate.        Everlene FarrierWilliam Blease Capaldi, PA-C 02/06/16 2124  Laurence Spatesachel Morgan Little, MD 02/07/16 [phone]

## 2016-02-06 NOTE — Discharge Instructions (Signed)

## 2016-02-06 NOTE — ED Notes (Signed)
Patient with dental pain upper left jaw.  States that she had been taking antibiotics but only had three pills.  She states that the antibiotics helped with the pain but since she ran out the pain came back.

## 2016-02-17 ENCOUNTER — Emergency Department (HOSPITAL_COMMUNITY)
Admission: EM | Admit: 2016-02-17 | Discharge: 2016-02-17 | Disposition: A | Discharge status: Home / Self Care | Payer: No Typology Code available for payment source | Attending: Emergency Medicine | Admitting: Emergency Medicine

## 2016-02-17 ENCOUNTER — Encounter (HOSPITAL_COMMUNITY): Payer: Self-pay | Admitting: Emergency Medicine

## 2016-02-17 DIAGNOSIS — Z3490 Encounter for supervision of normal pregnancy, unspecified, unspecified trimester: Secondary | ICD-10-CM

## 2016-02-17 DIAGNOSIS — F1999 Other psychoactive substance use, unspecified with unspecified psychoactive substance-induced disorder: Secondary | ICD-10-CM | POA: Insufficient documentation

## 2016-02-17 DIAGNOSIS — F1910 Other psychoactive substance abuse, uncomplicated: Secondary | ICD-10-CM

## 2016-02-17 DIAGNOSIS — O99321 Drug use complicating pregnancy, first trimester: Secondary | ICD-10-CM | POA: Insufficient documentation

## 2016-02-17 DIAGNOSIS — O219 Vomiting of pregnancy, unspecified: Secondary | ICD-10-CM | POA: Insufficient documentation

## 2016-02-17 DIAGNOSIS — O99331 Smoking (tobacco) complicating pregnancy, first trimester: Secondary | ICD-10-CM | POA: Insufficient documentation

## 2016-02-17 DIAGNOSIS — Z3A01 Less than 8 weeks gestation of pregnancy: Secondary | ICD-10-CM | POA: Insufficient documentation

## 2016-02-17 DIAGNOSIS — F17200 Nicotine dependence, unspecified, uncomplicated: Secondary | ICD-10-CM | POA: Insufficient documentation

## 2016-02-17 LAB — URINALYSIS, ROUTINE W REFLEX MICROSCOPIC
BILIRUBIN URINE: NEGATIVE
Glucose, UA: NEGATIVE mg/dL
HGB URINE DIPSTICK: NEGATIVE
KETONES UR: NEGATIVE mg/dL
Nitrite: NEGATIVE
PH: 6 (ref 5.0–8.0)
Protein, ur: NEGATIVE mg/dL
SPECIFIC GRAVITY, URINE: 1.019 (ref 1.005–1.030)

## 2016-02-17 LAB — POC URINE PREG, ED: PREG TEST UR: POSITIVE — AB

## 2016-02-17 LAB — URINE MICROSCOPIC-ADD ON

## 2016-02-17 MED ORDER — ONDANSETRON 4 MG PO TBDP
8.0000 mg | ORAL_TABLET | Freq: Once | ORAL | Status: AC
  Administered 2016-02-17: 8 mg via ORAL
  Filled 2016-02-17: qty 2

## 2016-02-17 NOTE — ED Notes (Signed)
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

## 2016-02-17 NOTE — Discharge Instructions (Signed)
First Trimester of Pregnancy  The first trimester of pregnancy is from week 1 until the end of week 12 (months 1 through 3). A week after a sperm fertilizes an egg, the egg will implant on the wall of the uterus. This embryo will begin to develop into a baby. Genes from you and your partner are forming the baby. The female genes determine whether the baby is a boy or a girl. At 6-8 weeks, the eyes and face are formed, and the heartbeat can be seen on ultrasound. At the end of 12 weeks, all the baby's organs are formed.   Now that you are pregnant, you will want to do everything you can to have a healthy baby. Two of the most important things are to get good prenatal care and to follow your health care provider's instructions. Prenatal care is all the medical care you receive before the baby's birth. This care will help prevent, find, and treat any problems during the pregnancy and childbirth.  BODY CHANGES  Your body goes through many changes during pregnancy. The changes vary from woman to woman.   · You may gain or lose a couple of pounds at first.  · You may feel sick to your stomach (nauseous) and throw up (vomit). If the vomiting is uncontrollable, call your health care provider.  · You may tire easily.  · You may develop headaches that can be relieved by medicines approved by your health care provider.  · You may urinate more often. Painful urination may mean you have a bladder infection.  · You may develop heartburn as a result of your pregnancy.  · You may develop constipation because certain hormones are causing the muscles that push waste through your intestines to slow down.  · You may develop hemorrhoids or swollen, bulging veins (varicose veins).  · Your breasts may begin to grow larger and become tender. Your nipples may stick out more, and the tissue that surrounds them (areola) may become darker.  · Your gums may bleed and may be sensitive to brushing and flossing.   · Dark spots or blotches (chloasma, mask of pregnancy) may develop on your face. This will likely fade after the baby is born.  · Your menstrual periods will stop.  · You may have a loss of appetite.  · You may develop cravings for certain kinds of food.  · You may have changes in your emotions from day to day, such as being excited to be pregnant or being concerned that something may go wrong with the pregnancy and baby.  · You may have more vivid and strange dreams.  · You may have changes in your hair. These can include thickening of your hair, rapid growth, and changes in texture. Some women also have hair loss during or after pregnancy, or hair that feels dry or thin. Your hair will most likely return to normal after your baby is born.  WHAT TO EXPECT AT YOUR PRENATAL VISITS  During a routine prenatal visit:  · You will be weighed to make sure you and the baby are growing normally.  · Your blood pressure will be taken.  · Your abdomen will be measured to track your baby's growth.  · The fetal heartbeat will be listened to starting around week 10 or 12 of your pregnancy.  · Test results from any previous visits will be discussed.  Your health care provider may ask you:  · How you are feeling.  · If you   including cigarettes, chewing tobacco, and electronic cigarettes. °· If you have any questions. °Other tests that may be performed during your first trimester include: °· Blood tests to find your blood type and to check for the presence of any previous infections. They will also be used to check for low iron levels (anemia) and Rh antibodies. Later in the pregnancy, blood tests for diabetes will be done along with other tests if problems develop. °· Urine tests to check for infections, diabetes, or protein in the urine. °· An ultrasound to confirm the proper growth  and development of the baby. °· An amniocentesis to check for possible genetic problems. °· Fetal screens for spina bifida and Down syndrome. °· You may need other tests to make sure you and the baby are doing well. °· HIV (human immunodeficiency virus) testing. Routine prenatal testing includes screening for HIV, unless you choose not to have this test. °HOME CARE INSTRUCTIONS  °Medicines °· Follow your health care provider's instructions regarding medicine use. Specific medicines may be either safe or unsafe to take during pregnancy. °· Take your prenatal vitamins as directed. °· If you develop constipation, try taking a stool softener if your health care provider approves. °Diet °· Eat regular, well-balanced meals. Choose a variety of foods, such as meat or vegetable-based protein, fish, milk and low-fat dairy products, vegetables, fruits, and whole grain breads and cereals. Your health care provider will help you determine the amount of weight gain that is right for you. °· Avoid raw meat and uncooked cheese. These carry germs that can cause birth defects in the baby. °· Eating four or five small meals rather than three large meals a day may help relieve nausea and vomiting. If you start to feel nauseous, eating a few soda crackers can be helpful. Drinking liquids between meals instead of during meals also seems to help nausea and vomiting. °· If you develop constipation, eat more high-fiber foods, such as fresh vegetables or fruit and whole grains. Drink enough fluids to keep your urine clear or pale yellow. °Activity and Exercise °· Exercise only as directed by your health care provider. Exercising will help you: °¨ Control your weight. °¨ Stay in shape. °¨ Be prepared for labor and delivery. °· Experiencing pain or cramping in the lower abdomen or low back is a good sign that you should stop exercising. Check with your health care provider before continuing normal exercises. °· Try to avoid standing for long  periods of time. Move your legs often if you must stand in one place for a long time. °· Avoid heavy lifting. °· Wear low-heeled shoes, and practice good posture. °· You may continue to have sex unless your health care provider directs you otherwise. °Relief of Pain or Discomfort °· Wear a good support bra for breast tenderness.   °· Take warm sitz baths to soothe any pain or discomfort caused by hemorrhoids. Use hemorrhoid cream if your health care provider approves.   °· Rest with your legs elevated if you have leg cramps or low back pain. °· If you develop varicose veins in your legs, wear support hose. Elevate your feet for 15 minutes, 3-4 times a day. Limit salt in your diet. °Prenatal Care °· Schedule your prenatal visits by the twelfth week of pregnancy. They are usually scheduled monthly at first, then more often in the last 2 months before delivery. °· Write down your questions. Take them to your prenatal visits. °· Keep all your prenatal visits as directed by your   health care provider. Safety  Wear your seat belt at all times when driving.  Make a list of emergency phone numbers, including numbers for family, friends, the hospital, and police and fire departments. General Tips  Ask your health care provider for a referral to a local prenatal education class. Begin classes no later than at the beginning of month 6 of your pregnancy.  Ask for help if you have counseling or nutritional needs during pregnancy. Your health care provider can offer advice or refer you to specialists for help with various needs.  Do not use hot tubs, steam rooms, or saunas.  Do not douche or use tampons or scented sanitary pads.  Do not cross your legs for long periods of time.  Avoid cat litter boxes and soil used by cats. These carry germs that can cause birth defects in the baby and possibly loss of the fetus by miscarriage or stillbirth.  Avoid all smoking, herbs, alcohol, and medicines not prescribed by  your health care provider. Chemicals in these affect the formation and growth of the baby.  Do not use any tobacco products, including cigarettes, chewing tobacco, and electronic cigarettes. If you need help quitting, ask your health care provider. You may receive counseling support and other resources to help you quit.  Schedule a dentist appointment. At home, brush your teeth with a soft toothbrush and be gentle when you floss. SEEK MEDICAL CARE IF:   You have dizziness.  You have mild pelvic cramps, pelvic pressure, or nagging pain in the abdominal area.  You have persistent nausea, vomiting, or diarrhea.  You have a bad smelling vaginal discharge.  You have pain with urination.  You notice increased swelling in your face, hands, legs, or ankles. SEEK IMMEDIATE MEDICAL CARE IF:   You have a fever.  You are leaking fluid from your vagina.  You have spotting or bleeding from your vagina.  You have severe abdominal cramping or pain.  You have rapid weight gain or loss.  You vomit blood or material that looks like coffee grounds.  You are exposed to [place] measles and have never had them.  You are exposed to fifth disease or chickenpox.  You develop a severe headache.  You have shortness of breath.  You have any kind of trauma, such as from a fall or a car accident.   This information is not intended to replace advice given to you by your health care provider. Make sure you discuss any questions you have with your health care provider.   Document Released: 07/13/2001 Document Revised: 08/09/2014 Document Reviewed: 05/29/2013 Elsevier Interactive Patient Education 2016 [employer]. [employer] Guide Outpatient Counseling/Substance Abuse Adult The United Ways 211 is a great source of information about community services available.  Access by dialing 2-1-1 from anywhere in [place], or by website -  PooledIncome.pl.   Other Local Resources (Updated  08/2015)  Crisis Hotlines   Services     Area Served  [employer]  Crisis Hotline, available 24 hours a day, 7 days a week: (505) 505-3642 [facility], [place]   [facility]  Crisis Hotline, available 24 hours a day, 7 days a week: 3460076002 [facility], [place]  [facility]  Suicide Prevention Hotline, available 24 hours a day, 7 days a week: 5406430765 [facility], [place]  [employer], available 24 hours a day, 7 days a week: [phone] 265-1317 [facility], [place]   [facility] Access to [employer],  available 24 hours a day, 7 days a week: 3048485373 All   [facility]  Crisis Hotline, available 24 hours a day, 7 days a week: 414-329-0415 All   Other Local Resources (Updated 08/2015)  Outpatient Counseling/ Substance Abuse Programs  Services     Address and Phone Number  [facility] ([facility])   Options include Individual counseling, group counseling, intensive outpatient program (several hours a day, several days a week)  Offers depression assessments  Provides methadone maintenance program [phone] [address] [place], [place] [address]   [facility]   Offers partial hospitalization/day treatment and DUI/DWI programs  [employer], private insurance [phone] [address] [place], [place] [address]  [facility]    Services include intensive outpatient program (several hours a day, several days a week), outpatient treatment, DUI/DWI services, family education  Also has some services specifically for [employer] transitional housing  [phone] [address] [place], [place] [address]     [facility]  [employer], private pay, and private insurance [phone] [address] [place], [place] [address]  [employer] [facility]  Services include individual  counseling, substance abuse intensive outpatient program (several hours a day, several days a week), day treatment  Delene Loll, Medicaid, private insurance [phone] [address] [place], [place] [address]  [facility]   Offers substance abuse intensive outpatient program (several hours a day, several days a week), partial hospitalization program [phone] [address] [place], [place] [address]  [phone] [address] [place], [place] [address]  [phone] [address] [place], [place] [address]  [phone], [address] [place], [place] [address]  [facility]  Individual counseling only  Accepts private insurance only [phone] [address] [place], [place] [address]  [facility]  Methadone maintenance program [phone] [address] [place], [place] [address]  [facility]  Walk-In Clinic providing substance abuse and mental health counseling  Accepts Medicaid, Medicare, private insurance  Offers sliding scale for uninsured [phone] [address] [place], [place]   [facility] [place], [employer].  Offers individual counseling, and intensive in-home services [phone] [address] [place], [place] [address]  [facility] individual counseling, family counseling, group therapy, domestic violence counseling, consumer credit counseling  Accepts Medicare, Medicaid, private insurance  Offers sliding scale for uninsured [phone] [address] [place], [place] [address]  [phone] [facility], [address] [place], [place] [address]  [facility]  Offers individual, family and group counseling  3 locations - [place], [place], and [place]  [phone]  [address] [place], [place] [address]  [address] [place], [place] [address]  [address] [place], [place] [address]  [facility]    Offers psychiatric assessment, 8-week Intensive Outpatient Program (several hours a day, several times a week, daytime or evenings), early recovery group, family Program, medication management  Private pay or private insurance only [phone], or  [phone] [address] [place], [place] [address]  [place] [employer] individual, couples and family counseling  Accepts Medicaid, private insurance, and sliding scale for uninsured [phone] [address] [place], [place] [address]  Len Blalock, MD  Individual counseling  Private insurance [phone] [address] [place], [place] [address]  [facility]   Offers assessment, substance abuse treatment, and behavioral  health treatment [phone] [address] [place], [place] [address]  [facility]  Individual counseling  Accepts private insurance [phone] [address] [place], [place] [address]  [facility]  Individual counseling  Delene Lollccepts Medicare, private insurance [phone] [address] [place], [place] [address]  [facility]    Offers intensive outpatient program (several hours a day, several times a week)  Private pay, private insurance [phone] [address] [facility] [address] [place], [place]  [facility]  Individual counseling  Medicare, private insurance [phone] [address] [place], [place] [address]  [facility]    Offers intensive outpatient program (several hours a day, several times a week) and partial hospitalization program [phone] [address] [place], [place] [address]  Emerson MonteParrish McKinney, MD  Individual counseling [phone] [address] [place], [place] [address]  [facility]  Offers Christian counseling to individuals, couples,  and families  Accepts Medicare and private insurance; offers sliding scale for uninsured [phone] [address] [place], [place] [address]  [facility]  [facility] [phone] [address] [place], [place] [address]  [facility]   Offers crisis counseling, individual counseling, group therapy, in-home therapy, domestic violence services, day treatment, DWI services, Administrator, artsCommunity Support Team (CST), Assertive Community Treatment Team (ACTT), substance abuse Intensive Outpatient Program (several hours a day, several times a week)  2 locations - [place] and [place] [phone] [address] [place], [place] [address]  [phone] [address] [place], [place] [address]  [facility]     Individual counseling and group therapy  Accepts private insurance, [place]are, [place] [phone] [address] [place], [place]  [facility]  Offers individual and family counseling  Offers LGBTQ services  Accepts private insurance and private pay [phone] [address] [place], [place] [address]  [facility]    Offers individual counseling, group therapy, and outpatient detox  Accepts private insurance [phone] [address] [place], [place]  [facility]  Individual counseling  Accepts Medicare, private insurance [phone] [address] [place], [place] [address]  [facility]  Individual counseling  Accepts Medicare, private insurance [phone] [address] [place], [place] [address]  [facility]   Offers substance abuse Intensive Outpatient Program (several hours a day, several times a week) [phone], or [phone] [place], [place]

## 2016-02-17 NOTE — ED Provider Notes (Signed)
CSN: [id number]     Arrival date & time 02/17/16  8295 History   First MD Initiated Contact with Patient 02/17/16 1019     Chief Complaint  Patient presents with  . Emesis  . Possible Pregnancy     (Consider location/radiation/quality/duration/timing/severity/associated sxs/prior Treatment) HPI 25 year old female presents today stating that last menstrual period was 40 days ago. She has had some nausea and vomiting over the past 2 weeks. She describes this as nausea that occurs after eating. She has been tolerating fluids without difficulty. 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 She denies any previous pregnancy. She is not having any vaginal bleeding or abdominal pain. She has poor dentition and is currently on antibiotics. She has not been using any birth control. Past Medical History  Diagnosis Date  . Anorexia   . Addiction to drug [facility])    Past Surgical History  Procedure Laterality Date  . Wisdom tooth extraction     History reviewed. No pertinent family history. Social History  Substance Use Topics  . Smoking status: Current Every Day Smoker  . Smokeless tobacco: None  . Alcohol Use: No   OB History    Gravida Para Term Preterm AB TAB SAB Ectopic Multiple Living       Review of Systems  All other systems reviewed and are negative.     Allergies  Penicillins and Sulfa antibiotics  Home Medications   Prior to Admission medications   Medication Sig Start Date End Date Taking? Authorizing Provider  bismuth subsalicylate (PEPTO BISMOL) 262 MG/15ML suspension Take 30 mLs by mouth every 6 (six) hours as needed for indigestion or diarrhea or loose stools.    Historical Provider, MD  ciprofloxacin (CIPRO) 500 MG tablet Take 1 tablet (500 mg total) by mouth 2 (two) times daily. 05/03/15   Tommi Rumps, PA-C  clindamycin (CLEOCIN) 150 MG capsule Take 2 capsules (300 mg total) by mouth 3 (three) times daily. May dispense as   capsules 02/06/16   Everlene Farrier, PA-C  cloNIDine (CATAPRES) 0.1 MG tablet Take 1 tablet (0.1 mg total) by mouth 2 (two) times daily. Patient not taking: Reported on 03/04/2015 03/15/13   Raeford Razor, MD  dicyclomine (BENTYL) 20 MG tablet Take 1 tablet (20 mg total) by mouth 2 (two) times daily. Patient not taking: Reported on 03/04/2015 03/15/13   Raeford Razor, MD  Lactobacillus (ACIDOPHILUS PROBIOTIC) 10 MG TABS Take 10 mg by mouth 3 (three) times daily. 02/06/16   Everlene Farrier, PA-C  naproxen (NAPROSYN) 250 MG tablet Take 1 tablet (250 mg total) by mouth 2 (two) times daily with a meal. 02/06/16   Everlene Farrier, PA-C  ondansetron (ZOFRAN ODT) 4 MG disintegrating tablet Take 1 tablet (4 mg total) by mouth every 8 (eight) hours as needed for nausea or vomiting. 05/03/15   Tommi Rumps, PA-C  oxyCODONE-acetaminophen (ROXICET) 5-325 MG per tablet Take 1-2 tablets by mouth every 4 (four) hours as needed (Pain). 03/06/15   Freeman Caldron, PA-C   BP 119/82 mmHg  Pulse 72  Temp(Src) 98.3 F (36.8 C) (Oral)  Resp 18  Ht  (1.702 m)  Wt 45.36 kg  BMI 15.66 kg/m2  SpO2 98% Physical Exam  Constitutional: She is oriented to person, place, and time. She appears well-developed and well-nourished. No distress.  HENT:  Head: Normocephalic and atraumatic.  Nose: Nose normal.  Mouth/Throat: Oropharynx is clear and moist.  Eyes: Conjunctivae and EOM are normal. Pupils are equal, round, and reactive to light.  Neck: Normal range of motion. Neck supple.  Cardiovascular: Normal rate and regular rhythm.   Pulmonary/Chest: Effort normal and breath sounds normal.  Abdominal: Soft. Bowel sounds are normal.  Musculoskeletal: Normal range of motion.  Track marks noted on arms  Neurological: She is alert and oriented to person, place, and time.  Skin: Skin is warm and dry.  Psychiatric: She has a normal mood and affect.  Nursing note and vitals reviewed.   ED Course  Procedures (including  critical care time) Labs Review Labs Reviewed  POC URINE PREG, ED - Abnormal; Notable for the following:    Preg Test, Ur POSITIVE (*)    All other components within normal limits  URINALYSIS, ROUTINE W REFLEX MICROSCOPIC (NOT AT [facility])    Imaging Review No results found. I have personally reviewed and evaluated these images and lab results as part of my medical decision-making.   EKG Interpretation None      MDM   Final diagnoses:  Pregnancy  Substance abuse  Nausea/vomiting in pregnancy    Patient given Zofran here and tolerating liquids. Plan Rx Zofran. Patient advised regarding resources for substance abuse and need [facility].    Margarita Grizzleanielle Quavion Boule, MD 02/19/16 1340

## 2016-02-20 ENCOUNTER — Emergency Department (HOSPITAL_COMMUNITY)
Admission: EM | Admit: 2016-02-20 | Discharge: 2016-02-20 | Disposition: A | Discharge status: Home / Self Care | Payer: No Typology Code available for payment source | Attending: Emergency Medicine | Admitting: Emergency Medicine

## 2016-02-20 ENCOUNTER — Encounter (HOSPITAL_COMMUNITY): Payer: Self-pay | Admitting: *Deleted

## 2016-02-20 DIAGNOSIS — F1890 Inhalant use, unspecified, uncomplicated: Secondary | ICD-10-CM | POA: Insufficient documentation

## 2016-02-20 DIAGNOSIS — Z3490 Encounter for supervision of normal pregnancy, unspecified, unspecified trimester: Secondary | ICD-10-CM

## 2016-02-20 DIAGNOSIS — R740 Nonspecific elevation of levels of transaminase and lactic acid dehydrogenase [LDH]: Secondary | ICD-10-CM | POA: Insufficient documentation

## 2016-02-20 DIAGNOSIS — O99331 Smoking (tobacco) complicating pregnancy, first trimester: Secondary | ICD-10-CM | POA: Insufficient documentation

## 2016-02-20 DIAGNOSIS — O99321 Drug use complicating pregnancy, first trimester: Secondary | ICD-10-CM | POA: Insufficient documentation

## 2016-02-20 DIAGNOSIS — F1910 Other psychoactive substance abuse, uncomplicated: Secondary | ICD-10-CM

## 2016-02-20 DIAGNOSIS — F17200 Nicotine dependence, unspecified, uncomplicated: Secondary | ICD-10-CM | POA: Insufficient documentation

## 2016-02-20 DIAGNOSIS — R7401 Elevation of levels of liver transaminase levels: Secondary | ICD-10-CM

## 2016-02-20 DIAGNOSIS — Z79899 Other long term (current) drug therapy: Secondary | ICD-10-CM | POA: Insufficient documentation

## 2016-02-20 DIAGNOSIS — Z3A01 Less than 8 weeks gestation of pregnancy: Secondary | ICD-10-CM | POA: Insufficient documentation

## 2016-02-20 LAB — COMPREHENSIVE METABOLIC PANEL
ALT: 206 U/L — AB (ref 14–54)
AST: 102 U/L — ABNORMAL HIGH (ref 15–41)
Albumin: 3.9 g/dL (ref 3.5–5.0)
Alkaline Phosphatase: 74 U/L (ref 38–126)
Anion gap: 10 (ref 5–15)
BUN: 5 mg/dL — ABNORMAL LOW (ref 6–20)
CO2: 22 mmol/L (ref 22–32)
CREATININE: 0.61 mg/dL (ref 0.44–1.00)
Calcium: 9.5 mg/dL (ref 8.9–10.3)
Chloride: 105 mmol/L (ref 101–111)
Glucose, Bld: 91 mg/dL (ref 65–99)
Potassium: 3.8 mmol/L (ref 3.5–5.1)
Sodium: 137 mmol/L (ref 135–145)
TOTAL PROTEIN: 6.8 g/dL (ref 6.5–8.1)
Total Bilirubin: 0.5 mg/dL (ref 0.3–1.2)

## 2016-02-20 LAB — RAPID URINE DRUG SCREEN, HOSP PERFORMED
AMPHETAMINES: NOT DETECTED
BARBITURATES: NOT DETECTED
Benzodiazepines: NOT DETECTED
Cocaine: POSITIVE — AB
OPIATES: POSITIVE — AB
TETRAHYDROCANNABINOL: POSITIVE — AB

## 2016-02-20 LAB — CBC
HCT: 38.7 % (ref 36.0–46.0)
Hemoglobin: 12.9 g/dL (ref 12.0–15.0)
MCH: 29.8 pg (ref 26.0–34.0)
MCHC: 33.3 g/dL (ref 30.0–36.0)
MCV: 89.4 fL (ref 78.0–100.0)
PLATELETS: 255 10*3/uL (ref 150–400)
RBC: 4.33 MIL/uL (ref 3.87–5.11)
RDW: 13.3 % (ref 11.5–15.5)
WBC: 10.7 10*3/uL — ABNORMAL HIGH (ref 4.0–10.5)

## 2016-02-20 LAB — ETHANOL

## 2016-02-20 LAB — HCG, QUANTITATIVE, PREGNANCY: hCG, Beta Chain, Quant, S: 59532 m[IU]/mL — ABNORMAL HIGH (ref <5)

## 2016-02-20 NOTE — ED Notes (Signed)
Pt reports using heroine, recently found out she is pregnant and is requesting detox, last used 24 hours ago.  Pt has found a facility but needs medical clearance. Denies SI or HI.

## 2016-02-20 NOTE — Discharge Instructions (Signed)
Please call for ob follow up at [phone]- I discussed your care with Dr. Emelda FearFerguson  If you decide to have an elective termination, the [facility] [place] is [phone] You will need follow up regarding your hepatitis C. Drink plenty of fluids.

## 2016-02-20 NOTE — Progress Notes (Signed)
No return call [facility] this pm re: bed availability.  CSW will pass info on to weekend CSW for f/u.  Pt has list of OP resources and has followed up with several facilities on her own, thus far, without success. Pt has given her contact information to CSW, which will also be passed on to the weekend CSW.  Pt has given verbal permission to share her medical information [facility] should [facility].  Emotional support provided.  CSW will continue to follow and support pt in the community setting, as necessary.

## 2016-02-20 NOTE — ED Provider Notes (Signed)
CSN: [id number]     Arrival date & time 02/20/16  1632 History   First MD Initiated Contact with Patient 02/20/16 1858     Chief Complaint  Patient presents with  . Drug Problem  . Medical Clearance     (Consider location/radiation/quality/duration/timing/severity/associated sxs/prior Treatment) HPI 25 year old G1 P0 LMP early June requesting detox. Patient states that she has a long history of narcotics abuse. She has been shooting up but states that she has been snorting heroin since she found out that she was pregnant. She states that she has been snorting heroin today. She was seen here several days ago stating that she was nauseated and thought she was pregnant. At that time she had a positive pregnancy test. She states that she went to [facility] last night to be in their program. She had an evaluation there and they did not have any beds. She reports that ultrasound. I have reviewed the record through her everywhere and she is approximately [redacted] weeks pregnant. She was treated for a urinary tract infection with Rocephin there and has a prescription for nitrofurantoin.  Review of the records reveal elevated transaminases and appetite is screen positive for hep C   CLINICAL DATA:Emesis.Pregnancy.    EXAM:  OBSTETRIC <14 WK [place] AND TRANSVAGINAL OB US    TECHNIQUE:  Both transabdominal and transvaginal ultrasound examinations were  performed for complete evaluation of the gestation as well as the  maternal uterus, adnexal regions, and pelvic cul-de-sac.  Transvaginal technique was performed to assess early pregnancy.    COMPARISON:None.    FINDINGS:  Intrauterine gestational sac: Present    Yolk OZH:[id number]    Embryo:Present    Cardiac Activity: Present    Heart Rate: 132bpm    CRL:83mm 6 w 3 dUS EDC: 10/11/2016    Subchorionic hemorrhage:None visualized.    Maternal uterus/adnexae: Physiologic appearance of  the ovaries.    IMPRESSION:  Single living intrauterine pregnancy measuring 6 weeks 3 days. No  adverse finding.      Electronically Signed  By: Lebron Conners M.D.  On: 02/20/2016 00:43      US OB Transvaginal (02/20/2016 12:15 AM)  Procedure Note  Interface, Rad Results In - Fri Feb 20, 2016 12:45 AM EDT  CLINICAL DATA: Emesis. Pregnancy.  EXAM: OBSTETRIC <14 WK [place] AND TRANSVAGINAL OB US  TECHNIQUE: Both transabdominal and transvaginal ultrasound examinations were performed for complete evaluation of the gestation as well as the maternal uterus, adnexal regions, and pelvic cul-de-sac. Transvaginal technique was performed to assess early pregnancy.  COMPARISON: None.  FINDINGS: Intrauterine gestational sac: Present  Yolk sac: Present  Embryo: Present  Cardiac Activity: Present  Heart Rate: 132 bpm  CRL: 6 mm 6 w 3 d [place] EDC: 10/11/2016  Subchorionic hemorrhage: None visualized.  Maternal uterus/adnexae: Physiologic appearance of the ovaries.  IMPRESSION: Single living intrauterine pregnancy measuring 6 weeks 3 days. No adverse finding.   Electronically Signed By: Marnee Spring M.D. On: 02/20/2016 00:43    Back to top of Results from Last 3 Months   US OB < 14 Wks Transabd 1st Tri Single Gestation (02/20/2016 12:15 AM) US OB < 14 Wks Transabd 1st Tri Single Gestation (02/20/2016 12:15 AM)  Narrative  CLINICAL DATA:Emesis.Pregnancy.    EXAM:  OBSTETRIC <14 WK [place] AND TRANSVAGINAL OB US    TECHNIQUE:  Both transabdominal and transvaginal ultrasound examinations were  performed for complete evaluation of the gestation as well as the  maternal uterus, adnexal regions, and pelvic cul-de-sac.  Transvaginal  technique was performed to assess early pregnancy.    COMPARISON:None.    FINDINGS:  Intrauterine gestational sac: Present    Yolk WUJ:[id number]    Embryo:Present    Cardiac Activity: Present     Heart Rate: 132bpm    CRL:20mm 6 w 3 dUS EDC: 10/11/2016    Subchorionic hemorrhage:None visualized.    Maternal uterus/adnexae: Physiologic appearance of the ovaries.    IMPRESSION:  Single living intrauterine pregnancy measuring 6 weeks 3 days. No  adverse finding.      Electronically Signed  By: Lebron Conners M.D.  On: 02/20/2016 00:43      US OB < 14 Wks Transabd 1st Tri Single Gestation (02/20/2016 12:15 AM)  Procedure Note  Interface, Rad Results In - Fri Feb 20, 2016 12:45 AM EDT  CLINICAL DATA: Emesis. Pregnancy.  EXAM: OBSTETRIC <14 WK [place] AND TRANSVAGINAL OB US  TECHNIQUE: Both transabdominal and transvaginal ultrasound examinations were performed for complete evaluation of the gestation as well as the maternal uterus, adnexal regions, and pelvic cul-de-sac. Transvaginal technique was performed to assess early pregnancy.  COMPARISON: None.  FINDINGS: Intrauterine gestational sac: Present  Yolk sac: Present  Embryo: Present  Cardiac Activity: Present  Heart Rate: 132 bpm  CRL: 6 mm 6 w 3 d [place] EDC: 10/11/2016  Subchorionic hemorrhage: None visualized.  Maternal uterus/adnexae: Physiologic appearance of the ovaries.  IMPRESSION: Single living intrauterine pregnancy measuring 6 weeks 3 days. No adverse finding.   Electronically Signed By: Marnee Spring M.D. On: 02/20/2016 00:43    Back to top of Results from Last 3 Months   POCT Pregnancy Urine, Qualitative (02/19/2016 10:37 PM) POCT Pregnancy Urine, Qualitative (02/19/2016 10:37 PM)  Component Value Range  HCG Urine, POC Positive (A) Negative  Quality Control Internal, HCG Urine POC QC Acceptable   URINE PREGNANCY LOT NUMBER [id number]   URINE PREGNANCY EXPIRATION DATE 02/05/17    POCT Pregnancy Urine, Qualitative (02/19/2016 10:37 PM)  Specimen  Urine  Back to top of Results from Last 3 Months   Urinalysis with Culture Reflex  (02/19/2016 10:29 PM) Urinalysis with Culture Reflex (02/19/2016 10:29 PM)  Component Value Range  Color, UA Amber   Clarity, UA Cloudy   pH, UA 5.0 5.0-8.0  Leukocyte Esterase, UA Small (A) Negative  Nitrite, UA Positive (A) Negative  Protein, UA Negative Negative  Glucose, UA Negative Negative  Bilirubin, UA Negative Negative  RBC, UA 2 0-3 /HPF  WBC, UA 32 (H) 0-3 /HPF  Squam Epithel, UA 8 (H) 0-5 /HPF  Bacteria, UA Few (A) None Seen /HPF  Specific Gravity, UA 1.014 1.005-1.030  Ketones, UA Negative Negative  Urobilinogen, UA Negative Negative  Blood, UA Negative Negative  Mucus, UA Rare (A) None Seen /HPF   Urinalysis with Culture Reflex (02/19/2016 10:29 PM)  Specimen  Urine  Back to top of Results from Last 3 Months   GOLD SST EXTRA TUBE (02/19/2016 8:40 PM) GOLD SST EXTRA TUBE (02/19/2016 8:40 PM)  Specimen  Blood   GOLD SST EXTRA TUBE (02/19/2016 8:40 PM)  Narrative  Collected and received in lab.   Back to top of Results from Last 3 Months   LIGHT BLUE CITRATE EXTRA TUBE (02/19/2016 8:40 PM) LIGHT BLUE CITRATE EXTRA TUBE (02/19/2016 8:40 PM)  Specimen  Blood   LIGHT BLUE CITRATE EXTRA TUBE (02/19/2016 8:40 PM)  Narrative  Collected and received in lab.   Back to top of Results from Last 3 Months   CBC w/ Differential (02/19/2016 8:40 PM) CBC w/ Differential (  02/19/2016 8:40 PM)  Component Value Range  WBC 13.3 (H) 4.0-10.5 10*9/L  RBC 4.12 3.87-5.11 10*12/L  HGB 12.5 12.0-15.0 g/dL  HCT 29.5 28.4-13.2 %  MCV 88.1 78.0-100.0 fL  MCH 30.3 26.0-34.0 pg  MCHC 34.4 30.0-36.0 g/dL  RDW 44.0 10.2-72.5 %  MPV 10.7 8.8-12.7 fL  Platelet 282 150-400 10*9/L  Neutrophils % 64.0 %  Lymphocytes % 26.6 %  Monocytes % 8.4 %  Eosinophils % 0.8 %  Basophils % 0.2 %  Absolute Neutrophils 8.5 (H) 1.7-7.7 10*9/L  Absolute Lymphocytes 3.5 0.7-4.0 10*9/L  Absolute Monocytes 1.1 (H) 0.1-1.0 10*9/L  Absolute Eosinophils 0.1 0.0-0.7 10*9/L  Absolute Basophils  0.0 0.0-0.1 10*9/L   CBC w/ Differential (02/19/2016 8:40 PM)  Specimen  Blood  Back to top of Results from Last 3 Months   hCG, Quantitative, Pregnancy (02/19/2016 8:40 PM) hCG, Quantitative, Pregnancy (02/19/2016 8:40 PM)  Component Value Range  hCG Quant 36644 (H) <5 mIU/mL   hCG, Quantitative, Pregnancy (02/19/2016 8:40 PM)  Specimen  Blood   hCG, Quantitative, Pregnancy (02/19/2016 8:40 PM)  Narrative    GEST. AGECONC. (mIU/mL)  <=1 Week5 - 50  2 Weeks 50 - 500  3 Weeks 100 - 10,000  4 Weeks 1,000 - 30,000  5 Weeks 3,500 - 115,000  6-8 Weeks12,000 - 270,000   12 Weeks15,000 - 220,000  Non-pregnant females <5 mIU/mL   Back to top of Results from Last 3 Months   Hepatitis Panel, Acute (02/19/2016 8:40 PM) Hepatitis Panel, Acute (02/19/2016 8:40 PM)  Component Value Range  Hepatitis B Surface Ag Nonreactive Nonreactive  Hep A IgM Nonreactive Nonreactive  Hep B Core IgM Nonreactive Nonreactive  Hepatitis C Ab Reactive (A) Nonreactive   Hepatitis Panel, Acute (02/19/2016 8:40 PM)  Specimen  Blood   Hepatitis Panel, Acute (02/19/2016 8:40 PM)  Narrative    If recent HCV exposure in person tested is suspected, it is recommended supplemental HCV RNA testing be performed.A repeatedly reactive result is consistent with current HCV infection, or past HCV infection that has resolved, or biologic false positivity for HCV antibody; in such cases, supplemental HCV RNA testing is recommended. Samples with positive or equivocal HCV antibody results are automatically reflexed to the Molecular Microbiology Laboratory for HCV viral load testing.   Back to top of Results from Last 3 Months   Ethanol (02/19/2016 8:40 PM) Ethanol (02/19/2016 8:40 PM)  Component Value Range  Alcohol, Ethyl <11.0 <11.0 mg/dL   Ethanol (03/47/4259 5:63 PM)  Specimen   Blood   Ethanol (02/19/2016 8:40 PM)  Narrative    Lowest detectable limit for serum alcohol is 11 mg/dl. For medical purposes only.   Back to top of Results from Last 3 Months   ED Extra Lab Tubes (02/19/2016 8:40 PM) ED Extra Lab Tubes (02/19/2016 8:40 PM)  Specimen  Blood   ED Extra Lab Tubes (02/19/2016 8:40 PM)  Narrative  The following orders were created for panel order ED Extra Lab Tubes.  Procedure Abnormality Status   --------- ----------- ------   LIGHT BLUE CITRATE EXTR.Marland KitchenMarland Kitchen[[id number] result   GOLD SST EXTRA TUBE[[id number]] NormalFinal result     Please view results for these tests on the individual orders.   Back to top of Results from Last 3 Months   CBC w/ Differential (02/19/2016 8:40 PM) CBC w/ Differential (02/19/2016 8:40 PM)  Specimen  Blood   CBC w/ Differential (02/19/2016 8:40 PM)  Narrative  The following orders were created for panel order CBC w/ Differential.  Procedure  Abnormality Status   --------- ----------- ------   CBC w/ Differential[[id number]] AbnormalFinal result     Please view results for these tests on the individual orders.   Back to top of Results from Last 3 Months   Comprehensive Metabolic Panel (02/19/2016 8:40 PM) Comprehensive Metabolic Panel (02/19/2016 8:40 PM)  Component Value Range  Sodium 137 137-147 mmol/L  Potassium 3.6 (L) 3.7-5.3 mmol/L  Chloride 100 96-112 mmol/L  CO2 24.0 19.0-32.0 mmol/L  BUN 10 6-23 mg/dL  Creatinine 3.01 6.01-0.93 mg/dL  BUN/Creatinine Ratio 15   GFR MDRD Non Af Amer >60 >60 mL/min/1.49m2  GFR MDRD Af Amer >60 >60 mL/min/1.31m2  Anion Gap 13 5-15  mmol/L  Glucose 85 70-99 mg/dL  Calcium 8.8 2.3-55.7 mg/dL  Albumin 4.0 3.2-2.0 g/dL  Total Protein 6.9 2.5-4.2 g/dL  Total Bilirubin 0.5 7.0-6.2 mg/dL  AST 376 (H) 2-83 U/L  ALT 246 (H) 0-35 U/L  Alkaline Phosphatase      Past Medical History  Diagnosis Date  . Anorexia   . Addiction to drug [facility])    Past Surgical History  Procedure Laterality Date  . Wisdom tooth extraction     History reviewed. No pertinent family history. Social History  Substance Use Topics  . Smoking status: Current Every Day Smoker  . Smokeless tobacco: None  . Alcohol Use: No   OB History    Gravida Para Term Preterm AB TAB SAB Ectopic Multiple Living   0 0 0 0 0 0 0 0 0 0      Review of Systems    Allergies  Penicillins and Sulfa antibiotics  Home Medications   Prior to Admission medications   Medication Sig Start Date End Date Taking? Authorizing Provider  ciprofloxacin (CIPRO) 500 MG tablet Take 1 tablet (500 mg total) by mouth 2 (two) times daily. 05/03/15  Yes Tommi Rumps, PA-C  bismuth subsalicylate (PEPTO BISMOL) 262 MG/15ML suspension Take 30 mLs by mouth every 6 (six) hours as needed for indigestion or diarrhea or loose stools.    Historical Provider, MD  clindamycin (CLEOCIN) 150 MG capsule Take 2 capsules (300 mg total) by mouth 3 (three) times daily. May dispense as 150mg  capsules 02/06/16   Everlene Farrier, PA-C  cloNIDine (CATAPRES) 0.1 MG tablet Take 1 tablet (0.1 mg total) by mouth 2 (two) times daily. Patient not taking: Reported on 03/04/2015 03/15/13   Raeford Razor, MD  dicyclomine (BENTYL) 20 MG tablet Take 1 tablet (20 mg total) by mouth 2 (two) times daily. Patient not taking: Reported on 03/04/2015 03/15/13   Raeford Razor, MD  Lactobacillus (ACIDOPHILUS PROBIOTIC) 10 MG TABS Take 10 mg by mouth 3 (three) times daily. 02/06/16   Everlene Farrier, PA-C  naproxen (NAPROSYN) 250 MG tablet Take 1 tablet (250 mg total) by mouth 2 (two) times daily with a meal. 02/06/16   Everlene Farrier, PA-C  ondansetron (ZOFRAN ODT) 4 MG disintegrating tablet Take 1 tablet (4 mg total) by mouth every 8 (eight) hours as needed for nausea or vomiting. 05/03/15   Tommi Rumps, PA-C  oxyCODONE-acetaminophen (ROXICET) 5-325 MG per tablet Take 1-2 tablets by mouth every 4 (four) hours as needed (Pain). 03/06/15   Freeman Caldron, PA-C   BP 96/62 mmHg  Pulse 78  Temp(Src) 98.7 F (37.1 C) (Oral)  Resp 18  SpO2 100%  LMP 01/01/2016 Physical Exam  ED Course  Procedures (including critical care time) Labs Review Labs Reviewed  HCG, QUANTITATIVE, PREGNANCY  CBC  COMPREHENSIVE METABOLIC PANEL  URINE  RAPID DRUG SCREEN, HOSP PERFORMED  ETHANOL    Imaging Review No results found. I have personally reviewed and evaluated these images and lab results as part of my medical decision-making.   MDM   Final diagnoses:  Pregnant  Substance abuse  Transaminitis    1 pregnancy intrauterine pregnancy confirmed [place].  Discussed with Dr.Ferguson and will give patient clinic number [facility] [place]  2 substance abuse patient history of narcotics abuse and is requesting detox for heroin- [facility].  Augusto GambleJody, SW will have [place] tomorrow follow up referral tomorrow.   3 elevated transaminases 4 hep C positive-Discussed with Dr. Dulce Sellarutlaw- cannot tx during pregnancy, she will need repeat lfts.    Margarita Grizzleanielle Milayna Rotenberg, MD 02/21/16 0010

## 2016-02-20 NOTE — ED Notes (Signed)
Patient left at this time with all belongings. 

## 2016-02-20 NOTE — Progress Notes (Signed)
[facility] Nicole Moss K. Sherlon HandingHarris, LCAS-A, LPC-A, [facility]  Counselor 02/20/2016 10:04 PM

## 2016-02-20 NOTE — Progress Notes (Signed)
Contacted pt. [facility] at [facility] to [facility] cart for assessment. Kobi Aller K. Sherlon HandingHarris, LCAS-A, LPC-A, [facility]  Counselor 02/20/2016 9:57 PM

## 2016-02-20 NOTE — Progress Notes (Signed)
CSW spoke with pt and her mother re: pt's request for detox.  Per pt, she has been in contact with Alfredo BachJudith Holster, [place] Perinatal Substance Use Specialist @[facility] [place], who recommend [facility] [place].  CSW has called facility and left message re: bed availability/refferal.  Await return call.

## 2016-02-20 NOTE — ED Notes (Signed)
Snack given.

## 2016-02-20 NOTE — ED Notes (Signed)
SW and mother at bedside

## 2016-02-21 NOTE — Progress Notes (Signed)
Pt requested detox treatment for opiate use. CSW [facility] this morning, but did not receive a call back. CSW contacted pt via phone at [phone] to inform pt that she is still awaiting feedback. Pt was provided with a list of OP resources by CSW Pollyann Savoy. Pt stated that she is doing well today.    Ernestina Columbia, LCSWA [facility] Clinical Social Worker [phone]

## 2016-06-22 ENCOUNTER — Ambulatory Visit (INDEPENDENT_AMBULATORY_CARE_PROVIDER_SITE_OTHER): Payer: Medicaid Other | Admitting: Obstetrics and Gynecology

## 2016-06-22 ENCOUNTER — Encounter: Payer: Self-pay | Admitting: Obstetrics and Gynecology

## 2016-06-22 ENCOUNTER — Other Ambulatory Visit (HOSPITAL_COMMUNITY)
Admission: RE | Admit: 2016-06-22 | Discharge: 2016-06-22 | Disposition: A | Discharge status: Home / Self Care | Payer: Medicaid Other | Source: Ambulatory Visit | Attending: Obstetrics and Gynecology | Admitting: Obstetrics and Gynecology

## 2016-06-22 VITALS — BP 101/69 | HR 90 | Wt 120.0 lb

## 2016-06-22 DIAGNOSIS — O0930 Supervision of pregnancy with insufficient antenatal care, unspecified trimester: Secondary | ICD-10-CM | POA: Insufficient documentation

## 2016-06-22 DIAGNOSIS — Z124 Encounter for screening for malignant neoplasm of cervix: Secondary | ICD-10-CM

## 2016-06-22 DIAGNOSIS — Z113 Encounter for screening for infections with a predominantly sexual mode of transmission: Secondary | ICD-10-CM | POA: Insufficient documentation

## 2016-06-22 DIAGNOSIS — F1911 Other psychoactive substance abuse, in remission: Secondary | ICD-10-CM

## 2016-06-22 DIAGNOSIS — O0932 Supervision of pregnancy with insufficient antenatal care, second trimester: Secondary | ICD-10-CM

## 2016-06-22 DIAGNOSIS — O0990 Supervision of high risk pregnancy, unspecified, unspecified trimester: Secondary | ICD-10-CM

## 2016-06-22 DIAGNOSIS — Z01419 Encounter for gynecological examination (general) (routine) without abnormal findings: Secondary | ICD-10-CM | POA: Diagnosis present

## 2016-06-22 DIAGNOSIS — Z1151 Encounter for screening for human papillomavirus (HPV): Secondary | ICD-10-CM | POA: Diagnosis not present

## 2016-06-22 HISTORY — DX: Other psychoactive substance abuse, in remission: F19.11

## 2016-06-22 NOTE — Addendum Note (Signed)
Addended by: Catalina AntiguaONSTANT, Chaeli Judy on: 06/22/2016 04:13 PM   Modules accepted: Orders

## 2016-06-22 NOTE — Progress Notes (Signed)
  Subjective:    Nicole Moss is a G1P0000 2431w0d being seen today for her first obstetrical visit.  Her obstetrical history is significant for late onset of care and h/o polysubstance abuse (opiod and heroin) with reported last use in June. Patient does intend to breast feed. Pregnancy history fully reviewed.  Patient reports no complaints.  Vitals:   06/22/16 1543  BP: 101/69  Pulse: 90  Weight: 120 lb (54.4 kg)    HISTORY: OB History  Gravida Para Term Preterm AB Living  1 0 0 0 0 0  SAB TAB Ectopic Multiple Live Births  0 0 0 0      # Outcome Date GA Lbr Len/2nd Weight Sex Delivery Anes PTL Lv  1 Current              Past Medical History:  Diagnosis Date  . Addiction to drug (HCC)   . Anorexia    Past Surgical History:  Procedure Laterality Date  . WISDOM TOOTH EXTRACTION     Family History  Problem Relation Age of Onset  . Throat cancer Father      Exam    Uterus:   22 cm  Pelvic Exam:    Perineum: No Hemorrhoids, Normal Perineum   Vulva: normal   Vagina:  normal mucosa, normal discharge   pH:    Cervix: nulliparous appearance and cervix is closed and long   Adnexa: not evaluated   Bony Pelvis: gynecoid  System: Breast:  normal appearance, no masses or tenderness   Skin: normal coloration and turgor, no rashes    Neurologic: oriented, no focal deficits   Extremities: normal strength, tone, and muscle mass   HEENT extra ocular movement intact   Mouth/Teeth mucous membranes moist, pharynx normal without lesions and dental hygiene good   Neck supple and no masses   Cardiovascular: regular rate and rhythm   Respiratory:  chest clear, no wheezing, crepitations, rhonchi, normal symmetric air entry   Abdomen: soft, non-tender; bowel sounds normal; no masses,  no organomegaly   Urinary:       Assessment:    Pregnancy: G1P0000 Patient Active Problem List   Diagnosis Date Noted  . Supervision of high risk pregnancy, antepartum 06/22/2016  . History  of substance abuse 06/22/2016  . Polysubstance dependence including opioid type drug, episodic abuse (HCC) 03/15/2013  . Heroin abuse 03/15/2013  . Opioid abuse 03/15/2013        Plan:     Initial labs drawn. Prenatal vitamins. Problem list reviewed and updated. Genetic Screening discussed : too late.  Ultrasound discussed; fetal survey: ordered. Patient is not interested in initiating suboxone at this time. She reports good support system and removing herself from that environement  Follow up in 4 weeks for ROB and 2 hr glucola 50% of 30 min visit spent on counseling and coordination of care.     Nicole Moss 06/22/2016

## 2016-06-22 NOTE — Patient Instructions (Addendum)
Second Trimester of Pregnancy The second trimester is from week 13 through week 28 (months 4 through 6). The second trimester is often a time when you feel your best. Your body has also adjusted to being pregnant, and you begin to feel better physically. Usually, morning sickness has lessened or quit completely, you may have more energy, and you may have an increase in appetite. The second trimester is also a time when the fetus is growing rapidly. At the end of the sixth month, the fetus is about 9 inches long and weighs about 1 pounds. You will likely begin to feel the baby move (quickening) between 18 and 20 weeks of the pregnancy. Body changes during your second trimester Your body continues to go through many changes during your second trimester. The changes vary from woman to woman.  Your weight will continue to increase. You will notice your lower abdomen bulging out.  You may begin to get stretch marks on your hips, abdomen, and breasts.  You may develop headaches that can be relieved by medicines. The medicines should be approved by your health care provider.  You may urinate more often because the fetus is pressing on your bladder.  You may develop or continue to have heartburn as a result of your pregnancy.  You may develop constipation because certain hormones are causing the muscles that push waste through your intestines to slow down.  You may develop hemorrhoids or swollen, bulging veins (varicose veins).  You may have back pain. This is caused by:  Weight gain.  Pregnancy hormones that are relaxing the joints in your pelvis.  A shift in weight and the muscles that support your balance.  Your breasts will continue to grow and they will continue to become tender.  Your gums may bleed and may be sensitive to brushing and flossing.  Dark spots or blotches (chloasma, mask of pregnancy) may develop on your face. This will likely fade after the baby is born.  A dark line  from your belly button to the pubic area (linea nigra) may appear. This will likely fade after the baby is born.  You may have changes in your hair. These can include thickening of your hair, rapid growth, and changes in texture. Some women also have hair loss during or after pregnancy, or hair that feels dry or thin. Your hair will most likely return to normal after your baby is born. What to expect at prenatal visits During a routine prenatal visit:  You will be weighed to make sure you and the fetus are growing normally.  Your blood pressure will be taken.  Your abdomen will be measured to track your baby's growth.  The fetal heartbeat will be listened to.  Any test results from the previous visit will be discussed. Your health care provider may ask you:  How you are feeling.  If you are feeling the baby move.  If you have had any abnormal symptoms, such as leaking fluid, bleeding, severe headaches, or abdominal cramping.  If you are using any tobacco products, including cigarettes, chewing tobacco, and electronic cigarettes.  If you have any questions. Other tests that may be performed during your second trimester include:  Blood tests that check for:  Low iron levels (anemia).  Gestational diabetes (between 24 and 28 weeks).  Rh antibodies. This is to check for a protein on red blood cells (Rh factor).  Urine tests to check for infections, diabetes, or protein in the urine.  An ultrasound to   confirm the proper growth and development of the baby.  An amniocentesis to check for possible genetic problems.  Fetal screens for spina bifida and Down syndrome.  HIV (human immunodeficiency virus) testing. Routine prenatal testing includes screening for HIV, unless you choose not to have this test. Follow these instructions at home: Eating and drinking  Continue to eat regular, healthy meals.  Avoid raw meat, uncooked cheese, cat litter boxes, and soil used by cats. These  carry germs that can cause birth defects in the baby.  Take your prenatal vitamins.  Take 1500-2000 mg of calcium daily starting at the 20th week of pregnancy until you deliver your baby.  If you develop constipation:  Take over-the-counter or prescription medicines.  Drink enough fluid to keep your urine clear or pale yellow.  Eat foods that are high in fiber, such as fresh fruits and vegetables, whole grains, and beans.  Limit foods that are high in fat and processed sugars, such as fried and sweet foods. Activity  Exercise only as directed by your health care provider. Experiencing uterine cramps is a good sign to stop exercising.  Avoid heavy lifting, wear low heel shoes, and practice good posture.  Wear your seat belt at all times when driving.  Rest with your legs elevated if you have leg cramps or low back pain.  Wear a good support bra for breast tenderness.  Do not use hot tubs, steam rooms, or saunas. Lifestyle  Avoid all smoking, herbs, alcohol, and unprescribed drugs. These chemicals affect the formation and growth of the baby.  Do not use any products that contain nicotine or tobacco, such as cigarettes and e-cigarettes. If you need help quitting, ask your health care provider.  A sexual relationship may be continued unless your health care provider directs you otherwise. General instructions  Follow your health care provider's instructions regarding medicine use. There are medicines that are either safe or unsafe to take during pregnancy.  Take warm sitz baths to soothe any pain or discomfort caused by hemorrhoids. Use hemorrhoid cream if your health care provider approves.  If you develop varicose veins, wear support hose. Elevate your feet for 15 minutes, 3-4 times a day. Limit salt in your diet.  Visit your dentist if you have not gone yet during your pregnancy. Use a soft toothbrush to brush your teeth and be gentle when you floss.  Keep all follow-up  prenatal visits as told by your health care provider. This is important. Contact a health care provider if:  You have dizziness.  You have mild pelvic cramps, pelvic pressure, or nagging pain in the abdominal area.  You have persistent nausea, vomiting, or diarrhea.  You have a bad smelling vaginal discharge.  You have pain with urination. Get help right away if:  You have a fever.  You are leaking fluid from your vagina.  You have spotting or bleeding from your vagina.  You have severe abdominal cramping or pain.  You have rapid weight gain or weight loss.  You have shortness of breath with chest pain.  You notice sudden or extreme swelling of your face, hands, ankles, feet, or legs.  You have not felt your baby move in over an hour.  You have severe headaches that do not go away with medicine.  You have vision changes. Summary  The second trimester is from week 13 through week 28 (months 4 through 6). It is also a time when the fetus is growing rapidly.  Your body goes   through many changes during pregnancy. The changes vary from woman to woman.  Avoid all smoking, herbs, alcohol, and unprescribed drugs. These chemicals affect the formation and growth your baby.  Do not use any tobacco products, such as cigarettes, chewing tobacco, and e-cigarettes. If you need help quitting, ask your health care provider.  Contact your health care provider if you have any questions. Keep all prenatal visits as told by your health care provider. This is important. This information is not intended to replace advice given to you by your health care provider. Make sure you discuss any questions you have with your health care provider. Document Released: 07/13/2001 Document Revised: 12/25/2015 Document Reviewed: 09/19/2012 Elsevier Interactive Patient Education  2017 [employer].  Third Trimester of Pregnancy The third trimester is from week 29 through week 40 (months 7 through 9). The  third trimester is a time when the unborn baby (fetus) is growing rapidly. At the end of the ninth month, the fetus is about 20 inches in length and weighs 6-10 pounds. Body changes during your third trimester Your body goes through many changes during pregnancy. The changes vary from woman to woman. During the third trimester:  Your weight will continue to increase. You can expect to gain 25-35 pounds (11-16 kg) by the end of the pregnancy.  You may begin to get stretch marks on your hips, abdomen, and breasts.  You may urinate more often because the fetus is moving lower into your pelvis and pressing on your bladder.  You may develop or continue to have heartburn. This is caused by increased hormones that slow down muscles in the digestive tract.  You may develop or continue to have constipation because increased hormones slow digestion and cause the muscles that push waste through your intestines to relax.  You may develop hemorrhoids. These are swollen veins (varicose veins) in the rectum that can itch or be painful.  You may develop swollen, bulging veins (varicose veins) in your legs.  You may have increased body aches in the pelvis, back, or thighs. This is due to weight gain and increased hormones that are relaxing your joints.  You may have changes in your hair. These can include thickening of your hair, rapid growth, and changes in texture. Some women also have hair loss during or after pregnancy, or hair that feels dry or thin. Your hair will most likely return to normal after your baby is born.  Your breasts will continue to grow and they will continue to become tender. A yellow fluid (colostrum) may leak from your breasts. This is the first milk you are producing for your baby.  Your belly button may stick out.  You may notice more swelling in your hands, face, or ankles.  You may have increased tingling or numbness in your hands, arms, and legs. The skin on your belly may  also feel numb.  You may feel short of breath because of your expanding uterus.  You may have more problems sleeping. This can be caused by the size of your belly, increased need to urinate, and an increase in your body's metabolism.  You may notice the fetus "dropping," or moving lower in your abdomen.  You may have increased vaginal discharge.  Your cervix becomes thin and soft (effaced) near your due date. What to expect at prenatal visits You will have prenatal exams every 2 weeks until week 36. Then you will have weekly prenatal exams. During a routine prenatal visit:  You will be  weighed to make sure you and the fetus are growing normally.  Your blood pressure will be taken.  Your abdomen will be measured to track your baby's growth.  The fetal heartbeat will be listened to.  Any test results from the previous visit will be discussed.  You may have a cervical check near your due date to see if you have effaced. At around 36 weeks, your health care provider will check your cervix. At the same time, your health care provider will also perform a test on the secretions of the vaginal tissue. This test is to determine if a type of bacteria, Group B streptococcus, is present. Your health care provider will explain this further. Your health care provider may ask you:  What your birth plan is.  How you are feeling.  If you are feeling the baby move.  If you have had any abnormal symptoms, such as leaking fluid, bleeding, severe headaches, or abdominal cramping.  If you are using any tobacco products, including cigarettes, chewing tobacco, and electronic cigarettes.  If you have any questions. Other tests or screenings that may be performed during your third trimester include:  Blood tests that check for low iron levels (anemia).  Fetal testing to check the health, activity level, and growth of the fetus. Testing is done if you have certain medical conditions or if there are  problems during the pregnancy.  Nonstress test (NST). This test checks the health of your baby to make sure there are no signs of problems, such as the baby not getting enough oxygen. During this test, a belt is placed around your belly. The baby is made to move, and its heart rate is monitored during movement. What is false labor? False labor is a condition in which you feel small, irregular tightenings of the muscles in the womb (contractions) that eventually go away. These are called Braxton Hicks contractions. Contractions may last for hours, days, or even weeks before true labor sets in. If contractions come at regular intervals, become more frequent, increase in intensity, or become painful, you should see your health care provider. What are the signs of labor?  Abdominal cramps.  Regular contractions that start at 10 minutes apart and become stronger and more frequent with time.  Contractions that start on the top of the uterus and spread down to the lower abdomen and back.  Increased pelvic pressure and dull back pain.  A watery or bloody mucus discharge that comes from the vagina.  Leaking of amniotic fluid. This is also known as your "water breaking." It could be a slow trickle or a gush. Let your doctor know if it has a color or strange odor. If you have any of these signs, call your health care provider right away, even if it is before your due date. Follow these instructions at home: Eating and drinking  Continue to eat regular, healthy meals.  Do not eat:  Raw meat or meat spreads.  Unpasteurized milk or cheese.  Unpasteurized juice.  Store-made salad.  Refrigerated smoked seafood.  Hot dogs or deli meat, unless they are piping hot.  More than 6 ounces of albacore tuna a week.  Shark, swordfish, king mackerel, or tile fish.  Store-made salads.  Raw sprouts, such as mung bean or alfalfa sprouts.  Take prenatal vitamins as told by your health care  provider.  Take 1000 mg of calcium daily as told by your health care provider.  If you develop constipation:  Take over-the-counter or  prescription medicines.  Drink enough fluid to keep your urine clear or pale yellow.  Eat foods that are high in fiber, such as fresh fruits and vegetables, whole grains, and beans.  Limit foods that are high in fat and processed sugars, such as fried and sweet foods. Activity  Exercise only as directed by your health care provider. Healthy pregnant women should aim for 2 hours and 30 minutes of moderate exercise per week. If you experience any pain or discomfort while exercising, stop.  Avoid heavy lifting.  Do not exercise in extreme heat or humidity, or at high altitudes.  Wear low-heel, comfortable shoes.  Practice good posture.  Do not travel far distances unless it is absolutely necessary and only with the approval of your health care provider.  Wear your seat belt at all times while in a car, on a bus, or on a plane.  Take frequent breaks and rest with your legs elevated if you have leg cramps or low back pain.  Do not use hot tubs, steam rooms, or saunas.  You may continue to have sex unless your health care provider tells you otherwise. Lifestyle  Do not use any products that contain nicotine or tobacco, such as cigarettes and e-cigarettes. If you need help quitting, ask your health care provider.  Do not drink alcohol.  Do not use any medicinal herbs or unprescribed drugs. These chemicals affect the formation and growth of the baby.  If you develop varicose veins:  Wear support pantyhose or compression stockings as told by your healthcare provider.  Elevate your feet for 15 minutes, 3-4 times a day.  Wear a supportive maternity bra to help with breast tenderness. General instructions  Take over-the-counter and prescription medicines only as told by your health care provider. There are medicines that are either safe or  unsafe to take during pregnancy.  Take warm sitz baths to soothe any pain or discomfort caused by hemorrhoids. Use hemorrhoid cream or witch hazel if your health care provider approves.  Avoid cat litter boxes and soil used by cats. These carry germs that can cause birth defects in the baby. If you have a cat, ask someone to clean the litter box for you.  To prepare for the arrival of your baby:  Take prenatal classes to understand, practice, and ask questions about the labor and delivery.  Make a trial run to the hospital.  Visit the hospital and tour the maternity area.  Arrange for maternity or paternity leave through employers.  Arrange for family and friends to take care of pets while you are in the hospital.  Purchase a rear-facing car seat and make sure you know how to install it in your car.  Pack your hospital bag.  Prepare the baby's nursery. Make sure to remove all pillows and stuffed animals from the baby's crib to prevent suffocation.  Visit your dentist if you have not gone during your pregnancy. Use a soft toothbrush to brush your teeth and be gentle when you floss.  Keep all prenatal follow-up visits as told by your health care provider. This is important. Contact a health care provider if:  You are unsure if you are in labor or if your water has broken.  You become dizzy.  You have mild pelvic cramps, pelvic pressure, or nagging pain in your abdominal area.  You have lower back pain.  You have persistent nausea, vomiting, or diarrhea.  You have an unusual or bad smelling vaginal discharge.  You have  pain when you urinate. Get help right away if:  You have a fever.  You are leaking fluid from your vagina.  You have spotting or bleeding from your vagina.  You have severe abdominal pain or cramping.  You have rapid weight loss or weight gain.  You have shortness of breath with chest pain.  You notice sudden or extreme swelling of your face, hands,  ankles, feet, or legs.  Your baby makes fewer than 10 movements in 2 hours.  You have severe headaches that do not go away with medicine.  You have vision changes. Summary  The third trimester is from week 29 through week 40, months 7 through 9. The third trimester is a time when the unborn baby (fetus) is growing rapidly.  During the third trimester, your discomfort may increase as you and your baby continue to gain weight. You may have abdominal, leg, and back pain, sleeping problems, and an increased need to urinate.  During the third trimester your breasts will keep growing and they will continue to become tender. A yellow fluid (colostrum) may leak from your breasts. This is the first milk you are producing for your baby.  False labor is a condition in which you feel small, irregular tightenings of the muscles in the womb (contractions) that eventually go away. These are called Braxton Hicks contractions. Contractions may last for hours, days, or even weeks before true labor sets in.  Signs of labor can include: abdominal cramps; regular contractions that start at 10 minutes apart and become stronger and more frequent with time; watery or bloody mucus discharge that comes from the vagina; increased pelvic pressure and dull back pain; and leaking of amniotic fluid. This information is not intended to replace advice given to you by your health care provider. Make sure you discuss any questions you have with your health care provider. Document Released: 07/13/2001 Document Revised: 12/25/2015 Document Reviewed: 09/19/2012 Elsevier Interactive Patient Education  2017 [employer].  Contraception Choices Contraception (birth control) is the use of any methods or devices to prevent pregnancy. Below are some methods to help avoid pregnancy. Hormonal methods  Contraceptive implant. This is a thin, plastic tube containing progesterone hormone. It does not contain estrogen hormone. Your health  care provider inserts the tube in the inner part of the upper arm. The tube can remain in place for up to 3 years. After 3 years, the implant must be removed. The implant prevents the ovaries from releasing an egg (ovulation), thickens the cervical mucus to prevent sperm from entering the uterus, and thins the lining of the inside of the uterus.  Progesterone-only injections. These injections are given every 3 months by your health care provider to prevent pregnancy. This synthetic progesterone hormone stops the ovaries from releasing eggs. It also thickens cervical mucus and changes the uterine lining. This makes it harder for sperm to survive in the uterus.  Birth control pills. These pills contain estrogen and progesterone hormone. They work by preventing the ovaries from releasing eggs (ovulation). They also cause the cervical mucus to thicken, preventing the sperm from entering the uterus. Birth control pills are prescribed by a health care provider.Birth control pills can also be used to treat heavy periods.  Minipill. This type of birth control pill contains only the progesterone hormone. They are taken every day of each month and must be prescribed by your health care provider.  Birth control patch. The patch contains hormones similar to those in birth control pills. It  must be changed once a week and is prescribed by a health care provider.  Vaginal ring. The ring contains hormones similar to those in birth control pills. It is left in the vagina for 3 weeks, removed for 1 week, and then a new one is put back in place. The patient must be comfortable inserting and removing the ring from the vagina.A health care provider's prescription is necessary.  Emergency contraception. Emergency contraceptives prevent pregnancy after unprotected sexual intercourse. This pill can be taken right after sex or up to 5 days after unprotected sex. It is most effective the sooner you take the pills after having  sexual intercourse. Most emergency contraceptive pills are available without a prescription. Check with your pharmacist. Do not use emergency contraception as your only form of birth control. Barrier methods  Female condom. This is a thin sheath (latex or rubber) that is worn over the penis during sexual intercourse. It can be used with spermicide to increase effectiveness.  Female condom. This is a soft, loose-fitting sheath that is put into the vagina before sexual intercourse.  Diaphragm. This is a soft, latex, dome-shaped barrier that must be fitted by a health care provider. It is inserted into the vagina, along with a spermicidal jelly. It is inserted before intercourse. The diaphragm should be left in the vagina for 6 to 8 hours after intercourse.  Cervical cap. This is a round, soft, latex or plastic cup that fits over the cervix and must be fitted by a health care provider. The cap can be left in place for up to 48 hours after intercourse.  Sponge. This is a soft, circular piece of polyurethane foam. The sponge has spermicide in it. It is inserted into the vagina after wetting it and before sexual intercourse.  Spermicides. These are chemicals that kill or block sperm from entering the cervix and uterus. They come in the form of creams, jellies, suppositories, foam, or tablets. They do not require a prescription. They are inserted into the vagina with an applicator before having sexual intercourse. The process must be repeated every time you have sexual intercourse. Intrauterine contraception  Intrauterine device (IUD). This is a T-shaped device that is put in a woman's uterus during a menstrual period to prevent pregnancy. There are 2 types:  Copper IUD. This type of IUD is wrapped in copper wire and is placed inside the uterus. Copper makes the uterus and fallopian tubes produce a fluid that kills sperm. It can stay in place for 10 years.  Hormone IUD. This type of IUD contains the  hormone progestin (synthetic progesterone). The hormone thickens the cervical mucus and prevents sperm from entering the uterus, and it also thins the uterine lining to prevent implantation of a fertilized egg. The hormone can weaken or kill the sperm that get into the uterus. It can stay in place for 3-5 years, depending on which type of IUD is used. Permanent methods of contraception  Female tubal ligation. This is when the woman's fallopian tubes are surgically sealed, tied, or blocked to prevent the egg from traveling to the uterus.  Hysteroscopic sterilization. This involves placing a small coil or insert into each fallopian tube. Your doctor uses a technique called hysteroscopy to do the procedure. The device causes scar tissue to form. This results in permanent blockage of the fallopian tubes, so the sperm cannot fertilize the egg. It takes about 3 months after the procedure for the tubes to become blocked. You must use another form of  birth control for these 3 months.  Female sterilization. This is when the female has the tubes that carry sperm tied off (vasectomy).This blocks sperm from entering the vagina during sexual intercourse. After the procedure, the man can still ejaculate fluid (semen). Natural planning methods  Natural family planning. This is not having sexual intercourse or using a barrier method (condom, diaphragm, cervical cap) on days the woman could become pregnant.  Calendar method. This is keeping track of the length of each menstrual cycle and identifying when you are fertile.  Ovulation method. This is avoiding sexual intercourse during ovulation.  Symptothermal method. This is avoiding sexual intercourse during ovulation, using a thermometer and ovulation symptoms.  Post-ovulation method. This is timing sexual intercourse after you have ovulated. Regardless of which type or method of contraception you choose, it is important that you use condoms to protect against the  transmission of sexually transmitted infections (STIs). Talk with your health care provider about which form of contraception is most appropriate for you. This information is not intended to replace advice given to you by your health care provider. Make sure you discuss any questions you have with your health care provider. Document Released: 07/19/2005 Document Revised: 12/25/2015 Document Reviewed: 01/11/2013 Elsevier Interactive Patient Education  2017 [employer].   Breastfeeding Deciding to breastfeed is one of the best choices you can make for you and your baby. A change in hormones during pregnancy causes your breast tissue to grow and increases the number and size of your milk ducts. These hormones also allow proteins, sugars, and fats from your blood supply to make breast milk in your milk-producing glands. Hormones prevent breast milk from being released before your baby is born as well as prompt milk flow after birth. Once breastfeeding has begun, thoughts of your baby, as well as his or her sucking or crying, can stimulate the release of milk from your milk-producing glands. Benefits of breastfeeding For Your Baby  Your first milk (colostrum) helps your baby's digestive system function better.  There are antibodies in your milk that help your baby fight off infections.  Your baby has a lower incidence of asthma, allergies, and sudden infant death syndrome.  The nutrients in breast milk are better for your baby than infant formulas and are designed uniquely for your baby's needs.  Breast milk improves your baby's brain development.  Your baby is less likely to develop other conditions, such as childhood obesity, asthma, or type 2 diabetes mellitus. For You  Breastfeeding helps to create a very special bond between you and your baby.  Breastfeeding is convenient. Breast milk is always available at the correct temperature and costs nothing.  Breastfeeding helps to burn calories  and helps you lose the weight gained during pregnancy.  Breastfeeding makes your uterus contract to its prepregnancy size faster and slows bleeding (lochia) after you give birth.  Breastfeeding helps to lower your risk of developing type 2 diabetes mellitus, osteoporosis, and breast or ovarian cancer later in life. Signs that your baby is hungry Early Signs of Hunger  Increased alertness or activity.  Stretching.  Movement of the head from side to side.  Movement of the head and opening of the mouth when the corner of the mouth or cheek is stroked (rooting).  Increased sucking sounds, smacking lips, cooing, sighing, or squeaking.  Hand-to-mouth movements.  Increased sucking of fingers or hands. Late Signs of Hunger  Fussing.  Intermittent crying. Extreme Signs of Hunger  Signs of extreme hunger will  require calming and consoling before your baby will be able to breastfeed successfully. Do not wait for the following signs of extreme hunger to occur before you initiate breastfeeding:  Restlessness.  A loud, strong cry.  Screaming. Breastfeeding basics  Breastfeeding Initiation  Find a comfortable place to sit or lie down, with your neck and back well supported.  Place a pillow or rolled up blanket under your baby to bring him or her to the level of your breast (if you are seated). Nursing pillows are specially designed to help support your arms and your baby while you breastfeed.  Make sure that your baby's abdomen is facing your abdomen.  Gently massage your breast. With your fingertips, massage from your chest wall toward your nipple in a circular motion. This encourages milk flow. You may need to continue this action during the feeding if your milk flows slowly.  Support your breast with 4 fingers underneath and your thumb above your nipple. Make sure your fingers are well away from your nipple and your baby's mouth.  Stroke your baby's lips gently with your finger or  nipple.  When your baby's mouth is open wide enough, quickly bring your baby to your breast, placing your entire nipple and as much of the colored area around your nipple (areola) as possible into your baby's mouth.  More areola should be visible above your baby's upper lip than below the lower lip.  Your baby's tongue should be between his or her lower gum and your breast.  Ensure that your baby's mouth is correctly positioned around your nipple (latched). Your baby's lips should create a seal on your breast and be turned out (everted).  It is common for your baby to suck about 2-3 minutes in order to start the flow of breast milk. Latching  Teaching your baby how to latch on to your breast properly is very important. An improper latch can cause nipple pain and decreased milk supply for you and poor weight gain in your baby. Also, if your baby is not latched onto your nipple properly, he or she may swallow some air during feeding. This can make your baby fussy. Burping your baby when you switch breasts during the feeding can help to get rid of the air. However, teaching your baby to latch on properly is still the best way to prevent fussiness from swallowing air while breastfeeding. Signs that your baby has successfully latched on to your nipple:  Silent tugging or silent sucking, without causing you pain.  Swallowing heard between every 3-4 sucks.  Muscle movement above and in front of his or her ears while sucking. Signs that your baby has not successfully latched on to nipple:  Sucking sounds or smacking sounds from your baby while breastfeeding.  Nipple pain. If you think your baby has not latched on correctly, slip your finger into the corner of your baby's mouth to break the suction and place it between your baby's gums. Attempt breastfeeding initiation again. Signs of Successful Breastfeeding  Signs from your baby:  A gradual decrease in the number of sucks or complete cessation  of sucking.  Falling asleep.  Relaxation of his or her body.  Retention of a small amount of milk in his or her mouth.  Letting go of your breast by himself or herself. Signs from you:  Breasts that have increased in firmness, weight, and size 1-3 hours after feeding.  Breasts that are softer immediately after breastfeeding.  Increased milk volume, as well  as a change in milk consistency and color by the fifth day of breastfeeding.  Nipples that are not sore, cracked, or bleeding. Signs That Your Pecola Leisure is Getting Enough Milk  Wetting at least 1-2 diapers during the first 24 hours after birth.  Wetting at least 5-6 diapers every 24 hours for the first week after birth. The urine should be clear or pale yellow by 5 days after birth.  Wetting 6-8 diapers every 24 hours as your baby continues to grow and develop.  At least 3 stools in a 24-hour period by age 88 days. The stool should be soft and yellow.  At least 3 stools in a 24-hour period by age 93 days. The stool should be seedy and yellow.  No loss of weight greater than 10% of birth weight during the first 68 days of age.  Average weight gain of 4-7 ounces (113-198 g) per week after age 15 days.  Consistent daily weight gain by age 88 days, without weight loss after the age of 2 weeks. After a feeding, your baby may spit up a small amount. This is common. Breastfeeding frequency and duration Frequent feeding will help you make more milk and can prevent sore nipples and breast engorgement. Breastfeed when you feel the need to reduce the fullness of your breasts or when your baby shows signs of hunger. This is called "breastfeeding on demand." Avoid introducing a pacifier to your baby while you are working to establish breastfeeding (the first 4-6 weeks after your baby is born). After this time you may choose to use a pacifier. Research has shown that pacifier use during the first year of a baby's life decreases the risk of sudden  infant death syndrome (SIDS). Allow your baby to feed on each breast as long as he or she wants. Breastfeed until your baby is finished feeding. When your baby unlatches or falls asleep while feeding from the first breast, offer the second breast. Because newborns are often sleepy in the first few weeks of life, you may need to awaken your baby to get him or her to feed. Breastfeeding times will vary from baby to baby. However, the following rules can serve as a guide to help you ensure that your baby is properly fed:  Newborns (babies 67 weeks of age or younger) may breastfeed every 1-3 hours.  Newborns should not go longer than 3 hours during the day or 5 hours during the night without breastfeeding.  You should breastfeed your baby a minimum of 8 times in a 24-hour period until you begin to introduce solid foods to your baby at around 79 months of age. Breast milk pumping Pumping and storing breast milk allows you to ensure that your baby is exclusively fed your breast milk, even at times when you are unable to breastfeed. This is especially important if you are going back to work while you are still breastfeeding or when you are not able to be present during feedings. Your lactation consultant can give you guidelines on how long it is safe to store breast milk. A breast pump is a machine that allows you to pump milk from your breast into a sterile bottle. The pumped breast milk can then be stored in a refrigerator or freezer. Some breast pumps are operated by hand, while others use electricity. Ask your lactation consultant which type will work best for you. Breast pumps can be purchased, but some hospitals and breastfeeding support groups lease breast pumps on a monthly basis.  A lactation consultant can teach you how to hand express breast milk, if you prefer not to use a pump. Caring for your breasts while you breastfeed Nipples can become dry, cracked, and sore while breastfeeding. The following  recommendations can help keep your breasts moisturized and healthy:  Avoid using soap on your nipples.  Wear a supportive bra. Although not required, special nursing bras and tank tops are designed to allow access to your breasts for breastfeeding without taking off your entire bra or top. Avoid wearing underwire-style bras or extremely tight bras.  Air dry your nipples for 3-37minutes after each feeding.  Use only cotton bra pads to absorb leaked breast milk. Leaking of breast milk between feedings is normal.  Use lanolin on your nipples after breastfeeding. Lanolin helps to maintain your skin's normal moisture barrier. If you use pure lanolin, you do not need to wash it off before feeding your baby again. Pure lanolin is not toxic to your baby. You may also hand express a few drops of breast milk and gently massage that milk into your nipples and allow the milk to air dry. In the first few weeks after giving birth, some women experience extremely full breasts (engorgement). Engorgement can make your breasts feel heavy, warm, and tender to the touch. Engorgement peaks within 3-5 days after you give birth. The following recommendations can help ease engorgement:  Completely empty your breasts while breastfeeding or pumping. You may want to start by applying warm, moist heat (in the shower or with warm water-soaked hand towels) just before feeding or pumping. This increases circulation and helps the milk flow. If your baby does not completely empty your breasts while breastfeeding, pump any extra milk after he or she is finished.  Wear a snug bra (nursing or regular) or tank top for 1-2 days to signal your body to slightly decrease milk production.  Apply ice packs to your breasts, unless this is too uncomfortable for you.  Make sure that your baby is latched on and positioned properly while breastfeeding. If engorgement persists after 48 hours of following these recommendations, contact your  health care provider or a Advertising copywriter. Overall health care recommendations while breastfeeding  Eat healthy foods. Alternate between meals and snacks, eating 3 of each per day. Because what you eat affects your breast milk, some of the foods may make your baby more irritable than usual. Avoid eating these foods if you are sure that they are negatively affecting your baby.  Drink milk, fruit juice, and water to satisfy your thirst (about 10 glasses a day).  Rest often, relax, and continue to take your prenatal vitamins to prevent fatigue, stress, and anemia.  Continue breast self-awareness checks.  Avoid chewing and smoking tobacco. Chemicals from cigarettes that pass into breast milk and exposure to secondhand smoke may harm your baby.  Avoid alcohol and drug use, including marijuana. Some medicines that may be harmful to your baby can pass through breast milk. It is important to ask your health care provider before taking any medicine, including all over-the-counter and prescription medicine as well as vitamin and herbal supplements. It is possible to become pregnant while breastfeeding. If birth control is desired, ask your health care provider about options that will be safe for your baby. Contact a health care provider if:  You feel like you want to stop breastfeeding or have become frustrated with breastfeeding.  You have painful breasts or nipples.  Your nipples are cracked or bleeding.  Your breasts  are red, tender, or warm.  You have a swollen area on either breast.  You have a fever or chills.  You have nausea or vomiting.  You have drainage other than breast milk from your nipples.  Your breasts do not become full before feedings by the fifth day after you give birth.  You feel sad and depressed.  Your baby is too sleepy to eat well.  Your baby is having trouble sleeping.  Your baby is wetting less than 3 diapers in a 24-hour period.  Your baby has less  than 3 stools in a 24-hour period.  Your baby's skin or the white part of his or her eyes becomes yellow.  Your baby is not gaining weight by 34 days of age. Get help right away if:  Your baby is overly tired (lethargic) and does not want to wake up and feed.  Your baby develops an unexplained fever. This information is not intended to replace advice given to you by your health care provider. Make sure you discuss any questions you have with your health care provider. Document Released: 07/19/2005 Document Revised: 12/31/2015 Document Reviewed: 01/10/2013 Elsevier Interactive Patient Education  2017 [employer].

## 2016-06-22 NOTE — Progress Notes (Signed)
CLINICAL DATA:Emesis.Pregnancy.  EXAM: OBSTETRIC <14 WK US AND TRANSVAGINAL OB US  TECHNIQUE: Both transabdominal and transvaginal ultrasound examinations were performed for complete evaluation of the gestation as well as the maternal uterus, adnexal regions, and pelvic cul-de-sac. Transvaginal technique was performed to assess early pregnancy.  COMPARISON:None.  FINDINGS: Intrauterine gestational sac: Present  Yolk ZOX:WRUEAVWsac:Present  Embryo:Present  Cardiac Activity: Present  Heart Rate: 132bpm  CRL:506mm 6 w 3 dUS EDC: 10/11/2016  Subchorionic hemorrhage:None visualized.  Maternal uterus/adnexae: Physiologic appearance of the ovaries.  IMPRESSION: Single living intrauterine pregnancy measuring 6 weeks 3 days. No adverse finding.   Electronically Signed By: Lebron ConnersJonathonWatts M.D. On: 02/20/2016 00:43

## 2016-06-23 ENCOUNTER — Encounter: Payer: Self-pay | Admitting: *Deleted

## 2016-06-23 ENCOUNTER — Encounter: Payer: Self-pay | Admitting: Obstetrics and Gynecology

## 2016-06-23 ENCOUNTER — Telehealth: Payer: Self-pay | Admitting: *Deleted

## 2016-06-23 ENCOUNTER — Other Ambulatory Visit: Payer: Self-pay | Admitting: Obstetrics and Gynecology

## 2016-06-23 DIAGNOSIS — O0990 Supervision of high risk pregnancy, unspecified, unspecified trimester: Secondary | ICD-10-CM

## 2016-06-23 DIAGNOSIS — Z283 Underimmunization status: Secondary | ICD-10-CM | POA: Insufficient documentation

## 2016-06-23 DIAGNOSIS — B1920 Unspecified viral hepatitis C without hepatic coma: Secondary | ICD-10-CM

## 2016-06-23 DIAGNOSIS — Z2839 Other underimmunization status: Secondary | ICD-10-CM | POA: Insufficient documentation

## 2016-06-23 DIAGNOSIS — O9989 Other specified diseases and conditions complicating pregnancy, childbirth and the puerperium: Secondary | ICD-10-CM

## 2016-06-23 HISTORY — DX: Unspecified viral hepatitis C without hepatic coma: B19.20

## 2016-06-23 LAB — PRENATAL PROFILE (SOLSTAS)
Antibody Screen: NEGATIVE
BASOS PCT: 0 %
Basophils Absolute: 0 cells/uL (ref 0–200)
Eosinophils Absolute: 107 cells/uL (ref 15–500)
Eosinophils Relative: 1 %
HEMATOCRIT: 34.8 % — AB (ref 35.0–45.0)
HEMOGLOBIN: 11.6 g/dL — AB (ref 11.7–15.5)
HEP B S AG: NEGATIVE
HIV: NONREACTIVE
LYMPHS ABS: 2568 {cells}/uL (ref 850–3900)
Lymphocytes Relative: 24 %
MCH: 30.8 pg (ref 27.0–33.0)
MCHC: 33.3 g/dL (ref 32.0–36.0)
MCV: 92.3 fL (ref 80.0–100.0)
MONO ABS: 963 {cells}/uL — AB (ref 200–950)
MPV: 10.8 fL (ref 7.5–12.5)
Monocytes Relative: 9 %
NEUTROS ABS: 7062 {cells}/uL (ref 1500–7800)
NEUTROS PCT: 66 %
Platelets: 272 10*3/uL (ref 140–400)
RBC: 3.77 MIL/uL — AB (ref 3.80–5.10)
RDW: 13.6 % (ref 11.0–15.0)
Rh Type: POSITIVE
Rubella: <0.9 Index (ref <0.90)
WBC: 10.7 10*3/uL (ref 3.8–10.8)

## 2016-06-23 LAB — HEPATITIS C ANTIBODY: HCV Ab: REACTIVE — AB

## 2016-06-23 LAB — VARICELLA ZOSTER ANTIBODY, IGG: Varicella IgG: 539 Index — ABNORMAL HIGH (ref <135.00)

## 2016-06-23 MED ORDER — CEPHALEXIN 500 MG PO CAPS: 500.0000 mg | ORAL_CAPSULE | Freq: Four times a day (QID) | ORAL | 0 refills | Status: DC

## 2016-06-23 NOTE — Telephone Encounter (Signed)
Updated problem list

## 2016-06-24 LAB — CULTURE, OB URINE

## 2016-06-25 ENCOUNTER — Telehealth (HOSPITAL_COMMUNITY): Payer: Self-pay | Admitting: *Deleted

## 2016-06-25 LAB — GC/CHLAMYDIA PROBE AMP (~~LOC~~) NOT AT ARMC
CHLAMYDIA, DNA PROBE: NEGATIVE
NEISSERIA GONORRHEA: NEGATIVE

## 2016-06-25 NOTE — Telephone Encounter (Signed)
-----   Message from Catalina AntiguaPeggy Constant, MD sent at 06/23/2016  1:11 PM EST ----- Please inform patient of positive UTI. Rx has been e-prescribed  Thanks  peggy

## 2016-06-25 NOTE — Telephone Encounter (Signed)
Called pt, informed of UTI and instructed pt on medication use.  Pt acknowledged instructions.

## 2016-06-28 ENCOUNTER — Institutional Professional Consult (permissible substitution): Payer: Self-pay

## 2016-06-28 ENCOUNTER — Encounter (HOSPITAL_COMMUNITY): Payer: Self-pay

## 2016-06-28 ENCOUNTER — Inpatient Hospital Stay (HOSPITAL_COMMUNITY)
Admission: AD | Admit: 2016-06-28 | Discharge: 2016-06-28 | Disposition: A | Discharge status: Home / Self Care | Payer: Medicaid Other | Source: Ambulatory Visit | Attending: Obstetrics and Gynecology | Admitting: Obstetrics and Gynecology

## 2016-06-28 DIAGNOSIS — O99322 Drug use complicating pregnancy, second trimester: Secondary | ICD-10-CM | POA: Diagnosis not present

## 2016-06-28 DIAGNOSIS — Z3A24 24 weeks gestation of pregnancy: Secondary | ICD-10-CM | POA: Insufficient documentation

## 2016-06-28 DIAGNOSIS — Z2839 Other underimmunization status: Secondary | ICD-10-CM

## 2016-06-28 DIAGNOSIS — F1120 Opioid dependence, uncomplicated: Secondary | ICD-10-CM | POA: Insufficient documentation

## 2016-06-28 DIAGNOSIS — O99332 Smoking (tobacco) complicating pregnancy, second trimester: Secondary | ICD-10-CM | POA: Insufficient documentation

## 2016-06-28 DIAGNOSIS — O9989 Other specified diseases and conditions complicating pregnancy, childbirth and the puerperium: Secondary | ICD-10-CM

## 2016-06-28 DIAGNOSIS — O0990 Supervision of high risk pregnancy, unspecified, unspecified trimester: Secondary | ICD-10-CM

## 2016-06-28 DIAGNOSIS — O0992 Supervision of high risk pregnancy, unspecified, second trimester: Secondary | ICD-10-CM | POA: Diagnosis not present

## 2016-06-28 DIAGNOSIS — F1920 Other psychoactive substance dependence, uncomplicated: Secondary | ICD-10-CM

## 2016-06-28 DIAGNOSIS — Z283 Underimmunization status: Secondary | ICD-10-CM

## 2016-06-28 DIAGNOSIS — O0932 Supervision of pregnancy with insufficient antenatal care, second trimester: Secondary | ICD-10-CM | POA: Insufficient documentation

## 2016-06-28 LAB — PAIN MGMT, PROFILE 6 CONF W/O MM, U
6 Acetylmorphine: NEGATIVE ng/mL (ref <10)
ALCOHOL METABOLITES: NEGATIVE ng/mL (ref <500)
ALPHAHYDROXYALPRAZOLAM: NEGATIVE ng/mL (ref <25)
ALPHAHYDROXYMIDAZOLAM: NEGATIVE ng/mL (ref <50)
ALPHAHYDROXYTRIAZOLAM: NEGATIVE ng/mL (ref <50)
Aminoclonazepam: NEGATIVE ng/mL (ref <25)
Amphetamines: NEGATIVE ng/mL (ref <500)
BENZODIAZEPINES: NEGATIVE ng/mL (ref <100)
BENZOYLECGONINE: 32007 ng/mL — AB (ref <100)
Barbiturates: NEGATIVE ng/mL (ref <300)
COCAINE METABOLITE: POSITIVE ng/mL — AB (ref <150)
Codeine: NEGATIVE ng/mL (ref <50)
Creatinine: 118 mg/dL (ref >=20.0)
HYDROMORPHONE: NEGATIVE ng/mL (ref <50)
HYDROXYETHYLFLURAZEPAM: NEGATIVE ng/mL (ref <50)
Hydrocodone: NEGATIVE ng/mL (ref <50)
Lorazepam: NEGATIVE ng/mL (ref <50)
MARIJUANA METABOLITE: 815 ng/mL — AB (ref <5)
MARIJUANA METABOLITE: POSITIVE ng/mL — AB (ref <20)
MORPHINE: 324 ng/mL — AB (ref <50)
Methadone Metabolite: NEGATIVE ng/mL (ref <100)
NORHYDROCODONE: NEGATIVE ng/mL (ref <50)
Nordiazepam: NEGATIVE ng/mL (ref <50)
OPIATES: POSITIVE ng/mL — AB (ref <100)
OXAZEPAM: NEGATIVE ng/mL (ref <50)
OXYCODONE: NEGATIVE ng/mL (ref <100)
Oxidant: NEGATIVE ug/mL (ref <200)
PHENCYCLIDINE: NEGATIVE ng/mL (ref <25)
Please note:: 0
TEMAZEPAM: NEGATIVE ng/mL (ref <50)
pH: 7.69 (ref 4.5–9.0)

## 2016-06-28 LAB — URINE MICROSCOPIC-ADD ON: RBC / HPF: NONE SEEN RBC/hpf (ref 0–5)

## 2016-06-28 LAB — RAPID URINE DRUG SCREEN, HOSP PERFORMED
Amphetamines: NOT DETECTED
Barbiturates: NOT DETECTED
Benzodiazepines: NOT DETECTED
COCAINE: POSITIVE — AB
OPIATES: POSITIVE — AB
Tetrahydrocannabinol: POSITIVE — AB

## 2016-06-28 LAB — CYTOLOGY - PAP: Diagnosis: NEGATIVE

## 2016-06-28 LAB — URINALYSIS, ROUTINE W REFLEX MICROSCOPIC
Bilirubin Urine: NEGATIVE
GLUCOSE, UA: NEGATIVE mg/dL
Hgb urine dipstick: NEGATIVE
Ketones, ur: NEGATIVE mg/dL
NITRITE: POSITIVE — AB
PH: 7.5 (ref 5.0–8.0)
Protein, ur: NEGATIVE mg/dL
SPECIFIC GRAVITY, URINE: 1.015 (ref 1.005–1.030)

## 2016-06-28 NOTE — MAU Note (Signed)
Spoke with SW and they will come see patient.

## 2016-06-28 NOTE — MAU Note (Signed)
Pt states she is a heroin user, typically uses 3 times a day. Last use was last night. States she wants to stop using and wanted to try on her own, but throws up when she doesn't use.

## 2016-06-28 NOTE — MAU Provider Note (Signed)
MAU PROVIDER NOTE  Chief Complaint:  Addiction Problem  HPI: Nicole Moss is a 25 y.o. G1P0000 at [redacted]w[redacted]d who presents to maternity admissions seeking help for heroin addiction. Is here with her mother.   Last time she used heroin was last night.  Reports she would like to quit and feels that stopping cold [place] may be best.  Is open to other options for rehab and to have SW come and speak with her.  Has tried rehab in the past but her mom states that she was "not ready to make that change."  Feels as though she is ready now.  Has been having cold sweats and shaking.  No complications so far with her pregnancy otherwise. No other concerns at this time.    Denies contractions, leakage of fluid or vaginal bleeding.   Pregnancy Course: Drug addiction. Patient with preliminary +Hep C  Testing.  Quant reflex pending.   Past Medical History: Past Medical History:  Diagnosis Date  . Addiction to drug (HCC)   . Anorexia    Past obstetric history: OB History  Gravida Para Term Preterm AB Living  1 0 0 0 0 0  SAB TAB Ectopic Multiple Live Births  0 0 0 0      # Outcome Date GA Lbr Len/2nd Weight Sex Delivery Anes PTL Lv  1 Current              Past Surgical History: Past Surgical History:  Procedure Laterality Date  . WISDOM TOOTH EXTRACTION     Family History: Family History  Problem Relation Age of Onset  . Throat cancer Father    Social History: Social History  Substance Use Topics  . Smoking status: Current Every Day Smoker  . Smokeless tobacco: Never Used  . Alcohol use No   Allergies: Allergies  Allergen Reactions  . Penicillins Nausea And Vomiting    Has patient had a PCN reaction causing immediate rash, facial/tongue/throat swelling, SOB or lightheadedness with hypotension: No Has patient had a PCN reaction causing severe rash involving mucus membranes or skin necrosis: No Has patient had a PCN reaction that required hospitalization No Has patient had a PCN reaction  occurring within the last 10 years: Yes If all of the above answers are "NO", then may proceed with Cephalosporin use.   . Sulfa Antibiotics Nausea And Vomiting   Meds:  Prescriptions Prior to Admission  Medication Sig Dispense Refill Last Dose  . cephALEXin (KEFLEX) 500 MG capsule Take 1 capsule (500 mg total) by mouth 4 (four) times daily. (Patient taking differently: Take 500 mg by mouth 4 (four) times daily. Pt started on 06/27/2016.) 28 capsule 0 06/27/2016 at Unknown time  . ferrous sulfate 325 (65 FE) MG EC tablet Take 325 mg by mouth daily.    06/27/2016 at Unknown time  . prenatal vitamin w/FE, FA (NATACHEW) 29-1 MG CHEW chewable tablet Chew 1 tablet by mouth daily at 12 noon.   06/27/2016 at Unknown time  . bismuth subsalicylate (PEPTO BISMOL) 262 MG/15ML suspension Take 30 mLs by mouth every 6 (six) hours as needed for indigestion or diarrhea or loose stools.   06/22/2016   I have reviewed patient's Past Medical Hx, Surgical Hx, Family Hx, Social Hx, medications and allergies.   ROS:  A comprehensive ROS was negative except per HPI.   Physical Exam   Patient Vitals for the past 24 hrs:  BP Temp Pulse Resp Height Weight  06/28/16 0923 134/74 98 F (36.7 C) 101  18 5\' 7"  (1.702 m) 120 lb (54.4 kg)   Constitutional: Well-developed, well-nourished female, appears anxious.  Cardiovascular: RRR, no MRG Respiratory: normal effort, CTA B/L GI: Abd soft, non-tender, gravid appropriate for gestational age. Pos BS x 4 MS: Extremities nontender, no edema, normal ROM Neurologic: Alert and oriented x 4.  GU: Neg CVAT.  Pelvic: NEFG, physiologic discharge, no blood, cervix clean. No CMT FHT:  Baseline 140s , moderate variability, accelerations present, no decelerations Contractions: n/a   Labs: Results for orders placed or performed during the hospital encounter of 06/28/16 (from the past 24 hour(s))  Urinalysis, Routine w reflex microscopic ([facility])     Status: Abnormal    Collection Time: 06/28/16  9:20 AM  Result Value Ref Range   Color, Urine YELLOW YELLOW   APPearance HAZY (A) CLEAR   Specific Gravity, Urine 1.015 1.005 - 1.030   pH 7.5 5.0 - 8.0   Glucose, UA NEGATIVE NEGATIVE mg/dL   Hgb urine dipstick NEGATIVE NEGATIVE   Bilirubin Urine NEGATIVE NEGATIVE   Ketones, ur NEGATIVE NEGATIVE mg/dL   Protein, ur NEGATIVE NEGATIVE mg/dL   Nitrite POSITIVE (A) NEGATIVE   Leukocytes, UA SMALL (A) NEGATIVE  Urine microscopic-add on     Status: Abnormal   Collection Time: 06/28/16  9:20 AM  Result Value Ref Range   Squamous Epithelial / LPF 6-30 (A) NONE SEEN   WBC, UA 6-30 0 - 5 WBC/hpf   RBC / HPF NONE SEEN 0 - 5 RBC/hpf   Bacteria, UA MANY (A) NONE SEEN  Rapid urine drug screen (hospital performed)     Status: Abnormal   Collection Time: 06/28/16  9:20 AM  Result Value Ref Range   Opiates POSITIVE (A) NONE DETECTED   Cocaine POSITIVE (A) NONE DETECTED   Benzodiazepines NONE DETECTED NONE DETECTED   Amphetamines NONE DETECTED NONE DETECTED   Tetrahydrocannabinol POSITIVE (A) NONE DETECTED   Barbiturates NONE DETECTED NONE DETECTED   Imaging:  No results found.  MAU Course: UDS  Social work consult UA - with +nitrites, many bacteria, 6-30 squams.   MDM: Plan of care reviewed with patient, including labs and tests ordered and medical treatment.  Assessment: 1. Drug addiction (HCC)   2. Rubella non-immune status, antepartum   3. Supervision of high risk pregnancy, antepartum   4. Insufficient prenatal care in second trimester    [age] year old F presents to MAU for heroin addiction and seeking help.   Plan: Urine culture sent to lab. Discharge home in stable condition.  SW consult in [facility] tomorrow at 5 am  [facility] is interested in seeking help.  Patient with appointment today for follow up at [facility]   Follow-up Information    [facility]. Go on  06/29/2016.   Specialty:  Behavioral Health Why:  Appointment at 5 AM Contact information: [address] [place] [address] [phone]            Medication List    TAKE these medications   bismuth subsalicylate 262 MG/15ML suspension Commonly known as:  PEPTO BISMOL Take 30 mLs by mouth every 6 (six) hours as needed for indigestion or diarrhea or loose stools.   cephALEXin 500 MG capsule Commonly known as:  KEFLEX Take 1 capsule (500 mg total) by mouth 4 (four) times daily. What changed:  additional instructions   ferrous sulfate 325 (65 FE) MG EC tablet Take 325  mg by mouth daily.   prenatal vitamin w/FE, FA 29-1 MG Chew chewable tablet Chew 1 tablet by mouth daily at 12 noon.      Freddrick MarchYashika Amin, MD PGY-1 06/28/2016 11:51 AM  OB FELLOW DISCHARGE ATTESTATION  I have seen and examined this patient and agree with above documentation in the resident's note.   Ernestina PennaNicholas Marq Rebello, MD 2:41 PM

## 2016-06-28 NOTE — Progress Notes (Signed)
CSW was called to MAU (room 4) to meet with patient to provide resources for substance abuse intervention options. 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 CSW was emphatic and praised patient for seeking help.  CSW explored patient's previous treatment options.   Patient reported that patient attempted residential treatment but was not successful.  Patient was unable to verbal how patient's attempt at residential treatment was unsuccessful. However, patient communicated that at this time, outpatient treatment is what patient is most interested in.  CSW provided patient with outpatient clinic options and patient was [facility].  With patient's permission, CSW contacted [facility] via telephone and inquired about their intake process.  Patient was instructed to arrive at the clinic at [facility] on 06/29/2016, and be prepared to stay for 5-6 hours.  Patient was informed to bring patient's insurance card, valid ID, and any prescribed medications.  Patient had several question regarding the effects that Methadone could have on pregnancy. CSW encouraged patient to write her questions down and to ask to explore medical questions with patient's OBGYN and substance abuse clinic.  Patient requested CSW to explain clinic information with patient's mother, Jolly MangoDana Poindexter.  With patient's permission, patient's mother enter the room and CSW introduced herself.  Patient's mother was tearful and expressed that she wants patient to get the help that patient needs.  CSW explained the clinic process and encouraged patient's mother to continue to be supportive.  Patient agreed to attend the walk-in clinic and patient's mother agreed to support patient.  Patient and patient's mother had several questions about infant withdrawal.  CSW provided patient with NAS brochure and encouraged patient to continue conversations with OBGYN and pediatrician. CSW  provided patient with CSW contact information and encouraged patient to contact CSW if patient had any questions or concerns.  Blaine HamperAngel Boyd-Gilyard, MSW, LCSW Clinical Social Work [phone]

## 2016-06-28 NOTE — Discharge Instructions (Signed)

## 2016-06-29 LAB — HEMOGLOBINOPATHY EVALUATION
HEMATOCRIT: 35.9 % (ref 35.0–45.0)
HEMOGLOBIN: 11.8 g/dL (ref 11.7–15.5)
HGB A2 QUANT: 2.4 % (ref 1.8–3.5)
Hgb A: 96.6 % (ref >96.0)
Hgb F Quant: <1 % (ref <2.0)
MCH: 31.4 pg (ref 27.0–33.0)
MCV: 95.5 fL (ref 80.0–100.0)
RBC: 3.76 MIL/uL — ABNORMAL LOW (ref 3.80–5.10)
RDW: 13.7 % (ref 11.0–15.0)

## 2016-06-30 LAB — URINE CULTURE: Culture: >=100000 — AB

## 2016-06-30 LAB — HEPATITIS C RNA QUANTITATIVE
HCV QUANT LOG: 4.9 {Log} — AB (ref <1.18)
HCV Quantitative: 79603 IU/mL — ABNORMAL HIGH (ref <15)

## 2016-07-01 ENCOUNTER — Other Ambulatory Visit: Payer: Self-pay | Admitting: Obstetrics and Gynecology

## 2016-07-01 ENCOUNTER — Other Ambulatory Visit (HOSPITAL_COMMUNITY): Payer: Self-pay | Admitting: *Deleted

## 2016-07-01 ENCOUNTER — Encounter (HOSPITAL_COMMUNITY): Payer: Self-pay

## 2016-07-01 ENCOUNTER — Ambulatory Visit (HOSPITAL_COMMUNITY)
Admission: RE | Admit: 2016-07-01 | Discharge: 2016-07-01 | Disposition: A | Discharge status: Home / Self Care | Payer: Medicaid Other | Source: Ambulatory Visit | Attending: Obstetrics and Gynecology | Admitting: Obstetrics and Gynecology

## 2016-07-01 ENCOUNTER — Encounter: Payer: Self-pay | Admitting: Obstetrics and Gynecology

## 2016-07-01 ENCOUNTER — Telehealth: Payer: Self-pay | Admitting: *Deleted

## 2016-07-01 DIAGNOSIS — O0990 Supervision of high risk pregnancy, unspecified, unspecified trimester: Secondary | ICD-10-CM

## 2016-07-01 DIAGNOSIS — Z0489 Encounter for examination and observation for other specified reasons: Secondary | ICD-10-CM

## 2016-07-01 DIAGNOSIS — O98419 Viral hepatitis complicating pregnancy, unspecified trimester: Secondary | ICD-10-CM | POA: Diagnosis not present

## 2016-07-01 DIAGNOSIS — Z3A25 25 weeks gestation of pregnancy: Secondary | ICD-10-CM | POA: Insufficient documentation

## 2016-07-01 DIAGNOSIS — B182 Chronic viral hepatitis C: Secondary | ICD-10-CM | POA: Insufficient documentation

## 2016-07-01 DIAGNOSIS — O99322 Drug use complicating pregnancy, second trimester: Secondary | ICD-10-CM | POA: Diagnosis not present

## 2016-07-01 DIAGNOSIS — Z1389 Encounter for screening for other disorder: Secondary | ICD-10-CM

## 2016-07-01 DIAGNOSIS — O0992 Supervision of high risk pregnancy, unspecified, second trimester: Secondary | ICD-10-CM

## 2016-07-01 DIAGNOSIS — IMO0002 Reserved for concepts with insufficient information to code with codable children: Secondary | ICD-10-CM

## 2016-07-01 DIAGNOSIS — O98412 Viral hepatitis complicating pregnancy, second trimester: Principal | ICD-10-CM

## 2016-07-01 HISTORY — DX: Chronic viral hepatitis C: B18.2

## 2016-07-01 HISTORY — DX: Viral hepatitis complicating pregnancy, unspecified trimester: O98.419

## 2016-07-01 MED ORDER — CONCEPT OB 130-92.4-1 MG PO CAPS: 1.0000 | ORAL_CAPSULE | Freq: Every day | ORAL | 11 refills | Status: DC

## 2016-07-01 NOTE — Telephone Encounter (Signed)
Spoke to pt, is currently taking Keflex at this time, informed pt to continue antibiotic as directed and finish all of the medication.  Will check TOC at next visit.

## 2016-07-01 NOTE — Telephone Encounter (Signed)
-----   Message from Kathee Deltonarrie L Hillman, RN sent at 07/01/2016 11:52 AM EST -----   ----- Message ----- From: Hermina StaggersMichael L Ervin, MD Sent: 06/30/2016   5:20 PM To: [facility] Clinical Pool  Please treat pt's UTI with Macrobid 1 po bid x 7 days Thanks Casimiro NeedleMichael

## 2016-07-01 NOTE — Telephone Encounter (Addendum)
Pt was aware of + Hep C result, states she has not seen anyone for management of her Hep C, will refer to infectious disease for management. Sent refill to pharmacy for prenatal vitamin.

## 2016-07-01 NOTE — Telephone Encounter (Signed)
-----   Message from Catalina AntiguaPeggy Constant, MD sent at 07/01/2016  9:14 AM EST ----- Please inform patient of positive Hepatis C ( I am not sure if she knew about that) and she needs to be referred to infectious disease  Thanks  Peggy

## 2016-07-02 LAB — CYSTIC FIBROSIS DIAGNOSTIC STUDY

## 2016-07-22 ENCOUNTER — Encounter: Payer: Self-pay | Admitting: Obstetrics and Gynecology

## 2016-07-22 ENCOUNTER — Encounter: Payer: Self-pay | Admitting: *Deleted

## 2016-07-22 ENCOUNTER — Ambulatory Visit (INDEPENDENT_AMBULATORY_CARE_PROVIDER_SITE_OTHER): Payer: Medicaid Other | Admitting: Obstetrics and Gynecology

## 2016-07-22 VITALS — BP 99/65 | HR 95 | Wt 126.0 lb

## 2016-07-22 DIAGNOSIS — O09899 Supervision of other high risk pregnancies, unspecified trimester: Secondary | ICD-10-CM

## 2016-07-22 DIAGNOSIS — B182 Chronic viral hepatitis C: Secondary | ICD-10-CM | POA: Diagnosis not present

## 2016-07-22 DIAGNOSIS — F1190 Opioid use, unspecified, uncomplicated: Secondary | ICD-10-CM | POA: Insufficient documentation

## 2016-07-22 DIAGNOSIS — F1120 Opioid dependence, uncomplicated: Secondary | ICD-10-CM

## 2016-07-22 DIAGNOSIS — O98413 Viral hepatitis complicating pregnancy, third trimester: Secondary | ICD-10-CM

## 2016-07-22 DIAGNOSIS — O98419 Viral hepatitis complicating pregnancy, unspecified trimester: Principal | ICD-10-CM

## 2016-07-22 DIAGNOSIS — F1911 Other psychoactive substance abuse, in remission: Secondary | ICD-10-CM

## 2016-07-22 DIAGNOSIS — Z87898 Personal history of other specified conditions: Secondary | ICD-10-CM

## 2016-07-22 DIAGNOSIS — O0990 Supervision of high risk pregnancy, unspecified, unspecified trimester: Secondary | ICD-10-CM

## 2016-07-22 DIAGNOSIS — O9989 Other specified diseases and conditions complicating pregnancy, childbirth and the puerperium: Secondary | ICD-10-CM

## 2016-07-22 DIAGNOSIS — O2343 Unspecified infection of urinary tract in pregnancy, third trimester: Secondary | ICD-10-CM

## 2016-07-22 DIAGNOSIS — Z283 Underimmunization status: Secondary | ICD-10-CM

## 2016-07-22 HISTORY — DX: Opioid use, unspecified, uncomplicated: F11.90

## 2016-07-22 LAB — CBC
HEMATOCRIT: 34.6 % — AB (ref 35.0–45.0)
Hemoglobin: 11.4 g/dL — ABNORMAL LOW (ref 11.7–15.5)
MCH: 31.1 pg (ref 27.0–33.0)
MCHC: 32.9 g/dL (ref 32.0–36.0)
MCV: 94.3 fL (ref 80.0–100.0)
MPV: 11.1 fL (ref 7.5–12.5)
PLATELETS: 204 10*3/uL (ref 140–400)
RBC: 3.67 MIL/uL — ABNORMAL LOW (ref 3.80–5.10)
RDW: 13.6 % (ref 11.0–15.0)
WBC: 8.7 10*3/uL (ref 3.8–10.8)

## 2016-07-22 LAB — COMPREHENSIVE METABOLIC PANEL
ALT: 17 U/L (ref 6–29)
AST: 21 U/L (ref 10–30)
Albumin: 3.1 g/dL — ABNORMAL LOW (ref 3.6–5.1)
Alkaline Phosphatase: 66 U/L (ref 33–115)
BILIRUBIN TOTAL: 0.2 mg/dL (ref 0.2–1.2)
BUN: 4 mg/dL — AB (ref 7–25)
CALCIUM: 8.6 mg/dL (ref 8.6–10.2)
CO2: 20 mmol/L (ref 20–31)
Chloride: 106 mmol/L (ref 98–110)
Creat: 0.55 mg/dL (ref 0.50–1.10)
GLUCOSE: 102 mg/dL — AB (ref 65–99)
POTASSIUM: 4.1 mmol/L (ref 3.5–5.3)
Sodium: 137 mmol/L (ref 135–146)
Total Protein: 5.8 g/dL — ABNORMAL LOW (ref 6.1–8.1)

## 2016-07-22 NOTE — Progress Notes (Signed)
Prenatal Visit Note Date: 07/22/2016 Clinic: [facility]  Subjective:  Nicole Moss is a 25 y.o. G1P0000 at 61w2dbeing seen today for ongoing prenatal care.  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  Patient reports no complaints.   Contractions: Not present. Vag. Bleeding: None.  Movement: Present. Denies leaking of fluid.   The following portions of the patient's history were reviewed and updated as appropriate: allergies, current medications, past family history, past medical history, past social history, past surgical history and problem list. Problem list updated.  Objective:   Vitals:   07/22/16 0822  BP: 99/65  Pulse: 95  Weight: 126 lb (57.2 kg)    Fetal Status: Fetal Heart Rate (bpm): 140 Fundal Height: 28 cm Movement: Present     General:  Alert, oriented and cooperative. Patient is in no acute distress.  Skin: Skin is warm and dry. No rash noted.   Cardiovascular: Normal heart rate noted  Respiratory: Normal respiratory effort, no problems with respiration noted  Abdomen: Soft, gravid, appropriate for gestational age. Pain/Pressure: Present     Pelvic:  Cervical exam deferred        Extremities: Normal range of motion.  Edema: None  Mental Status: Normal mood and affect. Normal behavior. Normal judgment and thought content.   Urinalysis:      Assessment and Plan:  Pregnancy: G1P0000 at 229w2d1. Chronic hepatitis C complicating pregnancy, antepartum (HCGriggs+VL on 11/22. Will get baseline labs today. Needs GI appt PP - Comp Met (CMET) - Fibrinogen - PTT - INR/PT  2. Supervision  of high risk pregnancy, antepartum Routine care. 28wk labs today. Will do 1hr GCT since pt is a hard stick  3. [facility] They'd like to go up on it but pt unsure. I d/w her NAAS with the child and won't be able to go home with her at regular PP discharge. I told her to please feel free to allow [facility] to titrate up the methadone to effect and can try and come off it outside of pregnancy. She states she had a +UDS last visit b/c of increased social stressors but [place] seeing [place] SW [place]. Pt currently living with FOB's, who is in jail, family and she likes it there and feels safe there. Risks with substance abuse d/w pt. Will set up NICU tour for sometime in the third trimester. Has repeat growth u/s for one week. Would continue this qmonth during the pregnancy and also consider AP testing at some point if UDS during pregnancies are + still.   4. UTI in pregnancy, antepartum, third trimester TOC today - Urine culture  5. Methadone use (HCCWillardee above  6. Rubella non-immune status, antepartum MMR PP  Preterm labor symptoms and general obstetric precautions including but not limited to vaginal bleeding, contractions, leaking of fluid and fetal movement were reviewed in detail with the patient. Please refer to After Visit Summary for other counseling recommendations.  Return in about 2 weeks (around 08/05/2016) for rob.   ChaAletha HalimD

## 2016-07-23 LAB — APTT: aPTT: 33 s (ref 22–34)

## 2016-07-23 LAB — PROTIME-INR
INR: 1
PROTHROMBIN TIME: 10.6 s (ref 9.0–11.5)

## 2016-07-23 LAB — RPR

## 2016-07-23 LAB — FIBRINOGEN: Fibrinogen: 392 mg/dL (ref 175–425)

## 2016-07-23 LAB — GLUCOSE TOLERANCE, 1 HOUR (50G) W/O FASTING: GLUCOSE, 1 HR, GESTATIONAL: 100 mg/dL (ref <140)

## 2016-07-23 LAB — HIV ANTIBODY (ROUTINE TESTING W REFLEX): HIV: NONREACTIVE

## 2016-07-24 LAB — URINE CULTURE: Colony Count: >=100000

## 2016-07-27 ENCOUNTER — Other Ambulatory Visit: Payer: Self-pay | Admitting: Obstetrics and Gynecology

## 2016-07-27 MED ORDER — CEPHALEXIN 250 MG PO CAPS: ORAL_CAPSULE | ORAL | 1 refills | Status: DC

## 2016-07-29 ENCOUNTER — Telehealth: Payer: Self-pay | Admitting: *Deleted

## 2016-07-29 ENCOUNTER — Encounter (HOSPITAL_COMMUNITY): Payer: Self-pay

## 2016-07-29 ENCOUNTER — Ambulatory Visit (HOSPITAL_COMMUNITY)
Admission: RE | Admit: 2016-07-29 | Discharge: 2016-07-29 | Disposition: A | Discharge status: Home / Self Care | Payer: Medicaid Other | Source: Ambulatory Visit | Attending: Obstetrics and Gynecology | Admitting: Obstetrics and Gynecology

## 2016-07-29 ENCOUNTER — Other Ambulatory Visit (HOSPITAL_COMMUNITY): Payer: Self-pay | Admitting: *Deleted

## 2016-07-29 DIAGNOSIS — Z283 Underimmunization status: Secondary | ICD-10-CM

## 2016-07-29 DIAGNOSIS — F1920 Other psychoactive substance dependence, uncomplicated: Secondary | ICD-10-CM | POA: Diagnosis not present

## 2016-07-29 DIAGNOSIS — Z0489 Encounter for examination and observation for other specified reasons: Secondary | ICD-10-CM

## 2016-07-29 DIAGNOSIS — O99323 Drug use complicating pregnancy, third trimester: Principal | ICD-10-CM

## 2016-07-29 DIAGNOSIS — B182 Chronic viral hepatitis C: Secondary | ICD-10-CM | POA: Diagnosis not present

## 2016-07-29 DIAGNOSIS — Z3A29 29 weeks gestation of pregnancy: Secondary | ICD-10-CM | POA: Diagnosis not present

## 2016-07-29 DIAGNOSIS — O9989 Other specified diseases and conditions complicating pregnancy, childbirth and the puerperium: Secondary | ICD-10-CM

## 2016-07-29 DIAGNOSIS — O0990 Supervision of high risk pregnancy, unspecified, unspecified trimester: Secondary | ICD-10-CM

## 2016-07-29 DIAGNOSIS — O98413 Viral hepatitis complicating pregnancy, third trimester: Secondary | ICD-10-CM | POA: Insufficient documentation

## 2016-07-29 DIAGNOSIS — Z363 Encounter for antenatal screening for malformations: Secondary | ICD-10-CM | POA: Diagnosis not present

## 2016-07-29 DIAGNOSIS — O0932 Supervision of pregnancy with insufficient antenatal care, second trimester: Secondary | ICD-10-CM

## 2016-07-29 DIAGNOSIS — F1120 Opioid dependence, uncomplicated: Secondary | ICD-10-CM

## 2016-07-29 DIAGNOSIS — O09899 Supervision of other high risk pregnancies, unspecified trimester: Secondary | ICD-10-CM

## 2016-07-29 DIAGNOSIS — IMO0002 Reserved for concepts with insufficient information to code with codable children: Secondary | ICD-10-CM

## 2016-07-29 DIAGNOSIS — O98419 Viral hepatitis complicating pregnancy, unspecified trimester: Secondary | ICD-10-CM

## 2016-07-29 NOTE — Telephone Encounter (Signed)
-----   Message from Kathie Dikeeanna J Sola, [place] sent at 07/27/2016  1:22 PM EST ----- Dr.Pickens sent this to me this morning while I was there and when I called she didn't answer and the voicemail box was full.  ----- Message ----- From: Lafayette Bingharlie Pickens, MD Sent: 07/27/2016   8:53 AM To: Kathie Dikeeanna J Sola, CMA  Please send in keflex 500mg  po qid x 7d and tell her to do 250mg  po qhs for the rest of the pregnancy for prevention. Also it says she has a PCN allergy but I see keflex in her MAR from before. Thanks!

## 2016-07-29 NOTE — Telephone Encounter (Signed)
Pt aware abx at pharmacy and to continue abx throughout pregnancy

## 2016-08-02 NOTE — L&D Delivery Note (Signed)
Delivery Note  Patient presented in spontaneous labor.  She had an AROM at around 1315.  She progressed to complete without augmentation.  She started pushing around 1445.  At 4:34 PM a viable female was delivered via Vaginal, Spontaneous Delivery (Presentation: ROA).  APGAR: 7, 9; weight pending.   Placenta status: spontaneous, intact.  Cord: 3 VC without complications.  Cord gasses not collected.  Anesthesia:  epidural Episiotomy: None Lacerations: 1st degree;Labial (left) Suture Repair: 3.0 vicryl Est. Blood Loss (mL): 200  Mom to postpartum.  Baby to Couplet care / Skin to Skin.  Charlsie MerlesJulia Linley Moxley 10/13/2016, 5:05 PM

## 2016-08-04 ENCOUNTER — Ambulatory Visit (INDEPENDENT_AMBULATORY_CARE_PROVIDER_SITE_OTHER): Payer: Medicaid Other | Admitting: Obstetrics & Gynecology

## 2016-08-04 VITALS — BP 114/71 | HR 101 | Wt 127.0 lb

## 2016-08-04 DIAGNOSIS — O98413 Viral hepatitis complicating pregnancy, third trimester: Secondary | ICD-10-CM

## 2016-08-04 DIAGNOSIS — O0990 Supervision of high risk pregnancy, unspecified, unspecified trimester: Secondary | ICD-10-CM

## 2016-08-04 DIAGNOSIS — F1120 Opioid dependence, uncomplicated: Secondary | ICD-10-CM

## 2016-08-04 DIAGNOSIS — O98419 Viral hepatitis complicating pregnancy, unspecified trimester: Principal | ICD-10-CM

## 2016-08-04 DIAGNOSIS — F1190 Opioid use, unspecified, uncomplicated: Secondary | ICD-10-CM

## 2016-08-04 DIAGNOSIS — O99323 Drug use complicating pregnancy, third trimester: Secondary | ICD-10-CM

## 2016-08-04 DIAGNOSIS — Z23 Encounter for immunization: Secondary | ICD-10-CM | POA: Diagnosis not present

## 2016-08-04 DIAGNOSIS — B182 Chronic viral hepatitis C: Secondary | ICD-10-CM

## 2016-08-04 NOTE — Addendum Note (Signed)
Addended by: Gita KudoLASSITER, KRISTEN S on: 08/04/2016 08:50 AM   Modules accepted: Orders

## 2016-08-04 NOTE — Progress Notes (Signed)
   PRENATAL VISIT NOTE  Subjective:  Nicole Moss is a 26 y.o. G1P0000 at 6368w1d being seen today for ongoing prenatal care.  Nicole Moss is currently monitored for the following issues for this high-risk pregnancy and has History of heroin abuse; Supervision of high risk pregnancy, antepartum; History of substance abuse; Insufficient prenatal care; Rubella non-immune status, antepartum; Chronic hepatitis C complicating pregnancy, antepartum (HCC); UTI in pregnancy, antepartum, third trimester; and Methadone use (HCC) on her problem list.  Patient reports no complaints.  Contractions: Not present. Vag. Bleeding: None.  Movement: Present. Denies leaking of fluid.   The following portions of the patient's history were reviewed and updated as appropriate: allergies, current medications, past family history, past medical history, past social history, past surgical history and problem list. Problem list updated.  Objective:   Vitals:   08/04/16 0830  BP: 114/71  Pulse: (!) 101  Weight: 127 lb (57.6 kg)    Fetal Status: Fetal Heart Rate (bpm): 152   Movement: Present     General:  Alert, oriented and cooperative. Patient is in no acute distress.  Skin: Skin is warm and dry. No rash noted.   Cardiovascular: Normal heart rate noted  Respiratory: Normal respiratory effort, no problems with respiration noted  Abdomen: Soft, gravid, appropriate for gestational age. Pain/Pressure: Absent     Pelvic:  Cervical exam deferred        Extremities: Normal range of motion.  Edema: None  Mental Status: Normal mood and affect. Normal behavior. Normal judgment and thought content.   Assessment and Plan:  Pregnancy: G1P0000 at 8568w1d  1. Chronic hepatitis C complicating pregnancy, antepartum (HCC)   2. Methadone use (HCC) - goes every Wednesday to the methadone clinic and sees a counselor there weekly  3. Supervision of high risk pregnancy, antepartum - will schedule NICU tour - follow up u/s  09-09-16  Preterm labor symptoms and general obstetric precautions including but not limited to vaginal bleeding, contractions, leaking of fluid and fetal movement were reviewed in detail with the patient. Please refer to After Visit Summary for other counseling recommendations.  No Follow-up on file.   Allie BossierMyra C Alver Leete, MD

## 2016-08-06 ENCOUNTER — Encounter (HOSPITAL_COMMUNITY): Payer: Self-pay

## 2016-08-06 NOTE — Progress Notes (Signed)
NICU NAS tour scheduled for 08/12/16 at 1:00 p.m. Parent notified of date, time and location of appointment.

## 2016-08-08 LAB — PAIN MGMT, PROFILE 6 CONF W/O MM, U
6 ACETYLMORPHINE: NEGATIVE ng/mL (ref <10)
Alcohol Metabolites: NEGATIVE ng/mL (ref <500)
Amphetamines: NEGATIVE ng/mL (ref <500)
BENZODIAZEPINES: NEGATIVE ng/mL (ref <100)
Barbiturates: NEGATIVE ng/mL (ref <300)
Cocaine Metabolite: NEGATIVE ng/mL (ref <150)
Codeine: NEGATIVE ng/mL (ref <50)
Creatinine: 89.5 mg/dL (ref >=20.0)
EDDP: 19762 ng/mL — ABNORMAL HIGH (ref <100)
Hydrocodone: NEGATIVE ng/mL (ref <50)
Hydromorphone: NEGATIVE ng/mL (ref <50)
MARIJUANA METABOLITE: 881 ng/mL — AB (ref <5)
METHADONE METABOLITE: POSITIVE ng/mL — AB (ref <100)
METHADONE: 1740 ng/mL — AB (ref <100)
Marijuana Metabolite: POSITIVE ng/mL — AB (ref <20)
Morphine: 387 ng/mL — ABNORMAL HIGH (ref <50)
Norhydrocodone: NEGATIVE ng/mL (ref <50)
OPIATES: POSITIVE ng/mL — AB (ref <100)
OXYCODONE: NEGATIVE ng/mL (ref <100)
Oxidant: NEGATIVE ug/mL (ref <200)
PHENCYCLIDINE: NEGATIVE ng/mL (ref <25)
PLEASE NOTE: 0
pH: 6.91 (ref 4.5–9.0)

## 2016-08-19 ENCOUNTER — Encounter: Payer: Medicaid Other | Admitting: Student

## 2016-08-20 ENCOUNTER — Ambulatory Visit (INDEPENDENT_AMBULATORY_CARE_PROVIDER_SITE_OTHER): Payer: Medicaid Other | Admitting: Obstetrics & Gynecology

## 2016-08-20 VITALS — BP 117/75 | HR 91 | Wt 128.0 lb

## 2016-08-20 DIAGNOSIS — B182 Chronic viral hepatitis C: Secondary | ICD-10-CM

## 2016-08-20 DIAGNOSIS — O0990 Supervision of high risk pregnancy, unspecified, unspecified trimester: Secondary | ICD-10-CM

## 2016-08-20 DIAGNOSIS — Z87898 Personal history of other specified conditions: Secondary | ICD-10-CM

## 2016-08-20 DIAGNOSIS — O99323 Drug use complicating pregnancy, third trimester: Secondary | ICD-10-CM

## 2016-08-20 DIAGNOSIS — F1911 Other psychoactive substance abuse, in remission: Secondary | ICD-10-CM

## 2016-08-20 DIAGNOSIS — O98419 Viral hepatitis complicating pregnancy, unspecified trimester: Secondary | ICD-10-CM

## 2016-08-20 DIAGNOSIS — O98413 Viral hepatitis complicating pregnancy, third trimester: Secondary | ICD-10-CM

## 2016-08-20 DIAGNOSIS — F1120 Opioid dependence, uncomplicated: Secondary | ICD-10-CM

## 2016-08-20 DIAGNOSIS — F1190 Opioid use, unspecified, uncomplicated: Secondary | ICD-10-CM

## 2016-08-20 NOTE — Progress Notes (Signed)
   PRENATAL VISIT NOTE  Subjective:  Nicole Moss is a 26 y.o. SW G1P0000 at 8580w3d being seen today for ongoing prenatal care.  She is currently monitored for the following issues for this low-risk pregnancy and has History of heroin abuse; Supervision of high risk pregnancy, antepartum; History of substance abuse; Insufficient prenatal care; Rubella non-immune status, antepartum; Chronic hepatitis C complicating pregnancy, antepartum (HCC); UTI in pregnancy, antepartum, third trimester; and Methadone use (HCC) on her problem list.  Patient reports no complaints.  Contractions: Not present. Vag. Bleeding: None.  Movement: Present. Denies leaking of fluid.   The following portions of the patient's history were reviewed and updated as appropriate: allergies, current medications, past family history, past medical history, past social history, past surgical history and problem list. Problem list updated.  Objective:   Vitals:   08/20/16 0953  BP: 117/75  Pulse: 91  Weight: 128 lb (58.1 kg)    Fetal Status: Fetal Heart Rate (bpm): 153   Movement: Present     General:  Alert, oriented and cooperative. Patient is in no acute distress.  Skin: Skin is warm and dry. No rash noted.   Cardiovascular: Normal heart rate noted  Respiratory: Normal respiratory effort, no problems with respiration noted  Abdomen: Soft, gravid, appropriate for gestational age. Pain/Pressure: Absent     Pelvic:  Cervical exam deferred        Extremities: Normal range of motion.  Edema: None  Mental Status: Normal mood and affect. Normal behavior. Normal judgment and thought content.   Assessment and Plan:  Pregnancy: G1P0000 at 6680w3d  1. Supervision of high risk pregnancy, antepartum   2. Chronic hepatitis C complicating pregnancy, antepartum (HCC)   3. History of substance abuse - Rec stop all illegal substances due to effect on baby  4. Methadone use (HCC)   Preterm labor symptoms and general obstetric  precautions including but not limited to vaginal bleeding, contractions, leaking of fluid and fetal movement were reviewed in detail with the patient. Please refer to After Visit Summary for other counseling recommendations.  Return in about 2 weeks (around 09/03/2016).   Allie BossierMyra C Walda Hertzog, MD

## 2016-08-30 ENCOUNTER — Encounter: Payer: Self-pay | Admitting: Radiology

## 2016-09-02 ENCOUNTER — Encounter: Payer: Self-pay | Admitting: Student

## 2016-09-06 ENCOUNTER — Ambulatory Visit (INDEPENDENT_AMBULATORY_CARE_PROVIDER_SITE_OTHER): Payer: Medicaid Other | Admitting: Family Medicine

## 2016-09-06 VITALS — BP 104/70 | HR 90 | Wt 132.0 lb

## 2016-09-06 DIAGNOSIS — O99323 Drug use complicating pregnancy, third trimester: Secondary | ICD-10-CM

## 2016-09-06 DIAGNOSIS — O0990 Supervision of high risk pregnancy, unspecified, unspecified trimester: Secondary | ICD-10-CM

## 2016-09-06 DIAGNOSIS — F1120 Opioid dependence, uncomplicated: Secondary | ICD-10-CM

## 2016-09-06 DIAGNOSIS — F1190 Opioid use, unspecified, uncomplicated: Secondary | ICD-10-CM

## 2016-09-06 NOTE — Patient Instructions (Signed)
Third Trimester of Pregnancy The third trimester is from week 29 through week 40 (months 7 through 9). The third trimester is a time when the unborn baby (fetus) is growing rapidly. At the end of the ninth month, the fetus is about 20 inches in length and weighs 6-10 pounds. Body changes during your third trimester Your body goes through many changes during pregnancy. The changes vary from woman to woman. During the third trimester:  Your weight will continue to increase. You can expect to gain 25-35 pounds (11-16 kg) by the end of the pregnancy.  You may begin to get stretch marks on your hips, abdomen, and breasts.  You may urinate more often because the fetus is moving lower into your pelvis and pressing on your bladder.  You may develop or continue to have heartburn. This is caused by increased hormones that slow down muscles in the digestive tract.  You may develop or continue to have constipation because increased hormones slow digestion and cause the muscles that push waste through your intestines to relax.  You may develop hemorrhoids. These are swollen veins (varicose veins) in the rectum that can itch or be painful.  You may develop swollen, bulging veins (varicose veins) in your legs.  You may have increased body aches in the pelvis, back, or thighs. This is due to weight gain and increased hormones that are relaxing your joints.  You may have changes in your hair. These can include thickening of your hair, rapid growth, and changes in texture. Some women also have hair loss during or after pregnancy, or hair that feels dry or thin. Your hair will most likely return to normal after your baby is born.  Your breasts will continue to grow and they will continue to become tender. A yellow fluid (colostrum) may leak from your breasts. This is the first milk you are producing for your baby.  Your belly button may stick out.  You may notice more swelling in your hands, face, or  ankles.  You may have increased tingling or numbness in your hands, arms, and legs. The skin on your belly may also feel numb.  You may feel short of breath because of your expanding uterus.  You may have more problems sleeping. This can be caused by the size of your belly, increased need to urinate, and an increase in your body's metabolism.  You may notice the fetus "dropping," or moving lower in your abdomen.  You may have increased vaginal discharge.  Your cervix becomes thin and soft (effaced) near your due date. What to expect at prenatal visits You will have prenatal exams every 2 weeks until week 36. Then you will have weekly prenatal exams. During a routine prenatal visit:  You will be weighed to make sure you and the fetus are growing normally.  Your blood pressure will be taken.  Your abdomen will be measured to track your baby's growth.  The fetal heartbeat will be listened to.  Any test results from the previous visit will be discussed.  You may have a cervical check near your due date to see if you have effaced. At around 36 weeks, your health care provider will check your cervix. At the same time, your health care provider will also perform a test on the secretions of the vaginal tissue. This test is to determine if a type of bacteria, Group B streptococcus, is present. Your health care provider will explain this further. Your health care provider may ask you:    What your birth plan is.  How you are feeling.  If you are feeling the baby move.  If you have had any abnormal symptoms, such as leaking fluid, bleeding, severe headaches, or abdominal cramping.  If you are using any tobacco products, including cigarettes, chewing tobacco, and electronic cigarettes.  If you have any questions. Other tests or screenings that may be performed during your third trimester include:  Blood tests that check for low iron levels (anemia).  Fetal testing to check the health,  activity level, and growth of the fetus. Testing is done if you have certain medical conditions or if there are problems during the pregnancy.  Nonstress test (NST). This test checks the health of your baby to make sure there are no signs of problems, such as the baby not getting enough oxygen. During this test, a belt is placed around your belly. The baby is made to move, and its heart rate is monitored during movement. What is false labor? False labor is a condition in which you feel small, irregular tightenings of the muscles in the womb (contractions) that eventually go away. These are called Braxton Hicks contractions. Contractions may last for hours, days, or even weeks before true labor sets in. If contractions come at regular intervals, become more frequent, increase in intensity, or become painful, you should see your health care provider. What are the signs of labor?  Abdominal cramps.  Regular contractions that start at 10 minutes apart and become stronger and more frequent with time.  Contractions that start on the top of the uterus and spread down to the lower abdomen and back.  Increased pelvic pressure and dull back pain.  A watery or bloody mucus discharge that comes from the vagina.  Leaking of amniotic fluid. This is also known as your "water breaking." It could be a slow trickle or a gush. Let your doctor know if it has a color or strange odor. If you have any of these signs, call your health care provider right away, even if it is before your due date. Follow these instructions at home: Eating and drinking  Continue to eat regular, healthy meals.  Do not eat:  Raw meat or meat spreads.  Unpasteurized milk or cheese.  Unpasteurized juice.  Store-made salad.  Refrigerated smoked seafood.  Hot dogs or deli meat, unless they are piping hot.  More than 6 ounces of albacore tuna a week.  Shark, swordfish, king mackerel, or tile fish.  Store-made salads.  Raw  sprouts, such as mung bean or alfalfa sprouts.  Take prenatal vitamins as told by your health care provider.  Take 1000 mg of calcium daily as told by your health care provider.  If you develop constipation:  Take over-the-counter or prescription medicines.  Drink enough fluid to keep your urine clear or pale yellow.  Eat foods that are high in fiber, such as fresh fruits and vegetables, whole grains, and beans.  Limit foods that are high in fat and processed sugars, such as fried and sweet foods. Activity  Exercise only as directed by your health care provider. Healthy pregnant women should aim for 2 hours and 30 minutes of moderate exercise per week. If you experience any pain or discomfort while exercising, stop.  Avoid heavy lifting.  Do not exercise in extreme heat or humidity, or at high altitudes.  Wear low-heel, comfortable shoes.  Practice good posture.  Do not travel far distances unless it is absolutely necessary and only with the approval   of your health care provider.  Wear your seat belt at all times while in a car, on a bus, or on a plane.  Take frequent breaks and rest with your legs elevated if you have leg cramps or low back pain.  Do not use hot tubs, steam rooms, or saunas.  You may continue to have sex unless your health care provider tells you otherwise. Lifestyle  Do not use any products that contain nicotine or tobacco, such as cigarettes and e-cigarettes. If you need help quitting, ask your health care provider.  Do not drink alcohol.  Do not use any medicinal herbs or unprescribed drugs. These chemicals affect the formation and growth of the baby.  If you develop varicose veins:  Wear support pantyhose or compression stockings as told by your healthcare provider.  Elevate your feet for 15 minutes, 3-4 times a day.  Wear a supportive maternity bra to help with breast tenderness. General instructions  Take over-the-counter and prescription  medicines only as told by your health care provider. There are medicines that are either safe or unsafe to take during pregnancy.  Take warm sitz baths to soothe any pain or discomfort caused by hemorrhoids. Use hemorrhoid cream or witch hazel if your health care provider approves.  Avoid cat litter boxes and soil used by cats. These carry germs that can cause birth defects in the baby. If you have a cat, ask someone to clean the litter box for you.  To prepare for the arrival of your baby:  Take prenatal classes to understand, practice, and ask questions about the labor and delivery.  Make a trial run to the hospital.  Visit the hospital and tour the maternity area.  Arrange for maternity or paternity leave through employers.  Arrange for family and friends to take care of pets while you are in the hospital.  Purchase a rear-facing car seat and make sure you know how to install it in your car.  Pack your hospital bag.  Prepare the baby's nursery. Make sure to remove all pillows and stuffed animals from the baby's crib to prevent suffocation.  Visit your dentist if you have not gone during your pregnancy. Use a soft toothbrush to brush your teeth and be gentle when you floss.  Keep all prenatal follow-up visits as told by your health care provider. This is important. Contact a health care provider if:  You are unsure if you are in labor or if your water has broken.  You become dizzy.  You have mild pelvic cramps, pelvic pressure, or nagging pain in your abdominal area.  You have lower back pain.  You have persistent nausea, vomiting, or diarrhea.  You have an unusual or bad smelling vaginal discharge.  You have pain when you urinate. Get help right away if:  You have a fever.  You are leaking fluid from your vagina.  You have spotting or bleeding from your vagina.  You have severe abdominal pain or cramping.  You have rapid weight loss or weight gain.  You have  shortness of breath with chest pain.  You notice sudden or extreme swelling of your face, hands, ankles, feet, or legs.  Your baby makes fewer than 10 movements in 2 hours.  You have severe headaches that do not go away with medicine.  You have vision changes. Summary  The third trimester is from week 29 through week 40, months 7 through 9. The third trimester is a time when the unborn baby (fetus)   is growing rapidly.  During the third trimester, your discomfort may increase as you and your baby continue to gain weight. You may have abdominal, leg, and back pain, sleeping problems, and an increased need to urinate.  During the third trimester your breasts will keep growing and they will continue to become tender. A yellow fluid (colostrum) may leak from your breasts. This is the first milk you are producing for your baby.  False labor is a condition in which you feel small, irregular tightenings of the muscles in the womb (contractions) that eventually go away. These are called Braxton Hicks contractions. Contractions may last for hours, days, or even weeks before true labor sets in.  Signs of labor can include: abdominal cramps; regular contractions that start at 10 minutes apart and become stronger and more frequent with time; watery or bloody mucus discharge that comes from the vagina; increased pelvic pressure and dull back pain; and leaking of amniotic fluid. This information is not intended to replace advice given to you by your health care provider. Make sure you discuss any questions you have with your health care provider. Document Released: 07/13/2001 Document Revised: 12/25/2015 Document Reviewed: 09/19/2012 [employer] Interactive Patient Education  2017 [employer]   Breastfeeding Deciding to breastfeed is one of the best choices you can make for you and your baby. A change in hormones during pregnancy causes your breast tissue to grow and increases the number and size of your  milk ducts. These hormones also allow proteins, sugars, and fats from your blood supply to make breast milk in your milk-producing glands. Hormones prevent breast milk from being released before your baby is born as well as prompt milk flow after birth. Once breastfeeding has begun, thoughts of your baby, as well as his or her sucking or crying, can stimulate the release of milk from your milk-producing glands. Benefits of breastfeeding For Your Baby  Your first milk (colostrum) helps your baby's digestive system function better.  There are antibodies in your milk that help your baby fight off infections.  Your baby has a lower incidence of asthma, allergies, and sudden infant death syndrome.  The nutrients in breast milk are better for your baby than infant formulas and are designed uniquely for your baby's needs.  Breast milk improves your baby's brain development.  Your baby is less likely to develop other conditions, such as childhood obesity, asthma, or type 2 diabetes mellitus. For You  Breastfeeding helps to create a very special bond between you and your baby.  Breastfeeding is convenient. Breast milk is always available at the correct temperature and costs nothing.  Breastfeeding helps to burn calories and helps you lose the weight gained during pregnancy.  Breastfeeding makes your uterus contract to its prepregnancy size faster and slows bleeding (lochia) after you give birth.  Breastfeeding helps to lower your risk of developing type 2 diabetes mellitus, osteoporosis, and breast or ovarian cancer later in life. Signs that your baby is hungry Early Signs of Hunger  Increased alertness or activity.  Stretching.  Movement of the head from side to side.  Movement of the head and opening of the mouth when the corner of the mouth or cheek is stroked (rooting).  Increased sucking sounds, smacking lips, cooing, sighing, or squeaking.  Hand-to-mouth movements.  Increased  sucking of fingers or hands. Late Signs of Hunger  Fussing.  Intermittent crying. Extreme Signs of Hunger  Signs of extreme hunger will require calming and consoling before your baby will   be able to breastfeed successfully. Do not wait for the following signs of extreme hunger to occur before you initiate breastfeeding:  Restlessness.  A loud, strong cry.  Screaming. Breastfeeding basics  Breastfeeding Initiation  Find a comfortable place to sit or lie down, with your neck and back well supported.  Place a pillow or rolled up blanket under your baby to bring him or her to the level of your breast (if you are seated). Nursing pillows are specially designed to help support your arms and your baby while you breastfeed.  Make sure that your baby's abdomen is facing your abdomen.  Gently massage your breast. With your fingertips, massage from your chest wall toward your nipple in a circular motion. This encourages milk flow. You may need to continue this action during the feeding if your milk flows slowly.  Support your breast with 4 fingers underneath and your thumb above your nipple. Make sure your fingers are well away from your nipple and your baby's mouth.  Stroke your baby's lips gently with your finger or nipple.  When your baby's mouth is open wide enough, quickly bring your baby to your breast, placing your entire nipple and as much of the colored area around your nipple (areola) as possible into your baby's mouth.  More areola should be visible above your baby's upper lip than below the lower lip.  Your baby's tongue should be between his or her lower gum and your breast.  Ensure that your baby's mouth is correctly positioned around your nipple (latched). Your baby's lips should create a seal on your breast and be turned out (everted).  It is common for your baby to suck about 2-3 minutes in order to start the flow of breast milk. Latching  Teaching your baby how to latch  on to your breast properly is very important. An improper latch can cause nipple pain and decreased milk supply for you and poor weight gain in your baby. Also, if your baby is not latched onto your nipple properly, he or she may swallow some air during feeding. This can make your baby fussy. Burping your baby when you switch breasts during the feeding can help to get rid of the air. However, teaching your baby to latch on properly is still the best way to prevent fussiness from swallowing air while breastfeeding. Signs that your baby has successfully latched on to your nipple:  Silent tugging or silent sucking, without causing you pain.  Swallowing heard between every 3-4 sucks.  Muscle movement above and in front of his or her ears while sucking. Signs that your baby has not successfully latched on to nipple:  Sucking sounds or smacking sounds from your baby while breastfeeding.  Nipple pain. If you think your baby has not latched on correctly, slip your finger into the corner of your baby's mouth to break the suction and place it between your baby's gums. Attempt breastfeeding initiation again. Signs of Successful Breastfeeding  Signs from your baby:  A gradual decrease in the number of sucks or complete cessation of sucking.  Falling asleep.  Relaxation of his or her body.  Retention of a small amount of milk in his or her mouth.  Letting go of your breast by himself or herself. Signs from you:  Breasts that have increased in firmness, weight, and size 1-3 hours after feeding.  Breasts that are softer immediately after breastfeeding.  Increased milk volume, as well as a change in milk consistency and color by   the fifth day of breastfeeding.  Nipples that are not sore, cracked, or bleeding. Signs That Your Baby is Getting Enough Milk  Wetting at least 1-2 diapers during the first 24 hours after birth.  Wetting at least 5-6 diapers every 24 hours for the first week after  birth. The urine should be clear or pale yellow by 5 days after birth.  Wetting 6-8 diapers every 24 hours as your baby continues to grow and develop.  At least 3 stools in a 24-hour period by age 5 days. The stool should be soft and yellow.  At least 3 stools in a 24-hour period by age 7 days. The stool should be seedy and yellow.  No loss of weight greater than 10% of birth weight during the first 3 days of age.  Average weight gain of 4-7 ounces (113-198 g) per week after age 4 days.  Consistent daily weight gain by age 5 days, without weight loss after the age of 2 weeks. After a feeding, your baby may spit up a small amount. This is common. Breastfeeding frequency and duration Frequent feeding will help you make more milk and can prevent sore nipples and breast engorgement. Breastfeed when you feel the need to reduce the fullness of your breasts or when your baby shows signs of hunger. This is called "breastfeeding on demand." Avoid introducing a pacifier to your baby while you are working to establish breastfeeding (the first 4-6 weeks after your baby is born). After this time you may choose to use a pacifier. Research has shown that pacifier use during the first year of a baby's life decreases the risk of sudden infant death syndrome (SIDS). Allow your baby to feed on each breast as long as he or she wants. Breastfeed until your baby is finished feeding. When your baby unlatches or falls asleep while feeding from the first breast, offer the second breast. Because newborns are often sleepy in the first few weeks of life, you may need to awaken your baby to get him or her to feed. Breastfeeding times will vary from baby to baby. However, the following rules can serve as a guide to help you ensure that your baby is properly fed:  Newborns (babies 4 weeks of age or younger) may breastfeed every 1-3 hours.  Newborns should not go longer than 3 hours during the day or 5 hours during the night  without breastfeeding.  You should breastfeed your baby a minimum of 8 times in a 24-hour period until you begin to introduce solid foods to your baby at around 6 months of age. Breast milk pumping Pumping and storing breast milk allows you to ensure that your baby is exclusively fed your breast milk, even at times when you are unable to breastfeed. This is especially important if you are going back to work while you are still breastfeeding or when you are not able to be present during feedings. Your lactation consultant can give you guidelines on how long it is safe to store breast milk. A breast pump is a machine that allows you to pump milk from your breast into a sterile bottle. The pumped breast milk can then be stored in a refrigerator or freezer. Some breast pumps are operated by hand, while others use electricity. Ask your lactation consultant which type will work best for you. Breast pumps can be purchased, but some hospitals and breastfeeding support groups lease breast pumps on a monthly basis. A lactation consultant can teach you how to   hand express breast milk, if you prefer not to use a pump. Caring for your breasts while you breastfeed Nipples can become dry, cracked, and sore while breastfeeding. The following recommendations can help keep your breasts moisturized and healthy:  Avoid using soap on your nipples.  Wear a supportive bra. Although not required, special nursing bras and tank tops are designed to allow access to your breasts for breastfeeding without taking off your entire bra or top. Avoid wearing underwire-style bras or extremely tight bras.  Air dry your nipples for 3-4minutes after each feeding.  Use only cotton bra pads to absorb leaked breast milk. Leaking of breast milk between feedings is normal.  Use lanolin on your nipples after breastfeeding. Lanolin helps to maintain your skin's normal moisture barrier. If you use pure lanolin, you do not need to wash it off  before feeding your baby again. Pure lanolin is not toxic to your baby. You may also hand express a few drops of breast milk and gently massage that milk into your nipples and allow the milk to air dry. In the first few weeks after giving birth, some women experience extremely full breasts (engorgement). Engorgement can make your breasts feel heavy, warm, and tender to the touch. Engorgement peaks within 3-5 days after you give birth. The following recommendations can help ease engorgement:  Completely empty your breasts while breastfeeding or pumping. You may want to start by applying warm, moist heat (in the shower or with warm water-soaked hand towels) just before feeding or pumping. This increases circulation and helps the milk flow. If your baby does not completely empty your breasts while breastfeeding, pump any extra milk after he or she is finished.  Wear a snug bra (nursing or regular) or tank top for 1-2 days to signal your body to slightly decrease milk production.  Apply ice packs to your breasts, unless this is too uncomfortable for you.  Make sure that your baby is latched on and positioned properly while breastfeeding. If engorgement persists after 48 hours of following these recommendations, contact your health care provider or a lactation consultant. Overall health care recommendations while breastfeeding  Eat healthy foods. Alternate between meals and snacks, eating 3 of each per day. Because what you eat affects your breast milk, some of the foods may make your baby more irritable than usual. Avoid eating these foods if you are sure that they are negatively affecting your baby.  Drink milk, fruit juice, and water to satisfy your thirst (about 10 glasses a day).  Rest often, relax, and continue to take your prenatal vitamins to prevent fatigue, stress, and anemia.  Continue breast self-awareness checks.  Avoid chewing and smoking tobacco. Chemicals from cigarettes that pass  into breast milk and exposure to secondhand smoke may harm your baby.  Avoid alcohol and drug use, including marijuana. Some medicines that may be harmful to your baby can pass through breast milk. It is important to ask your health care provider before taking any medicine, including all over-the-counter and prescription medicine as well as vitamin and herbal supplements. It is possible to become pregnant while breastfeeding. If birth control is desired, ask your health care provider about options that will be safe for your baby. Contact a health care provider if:  You feel like you want to stop breastfeeding or have become frustrated with breastfeeding.  You have painful breasts or nipples.  Your nipples are cracked or bleeding.  Your breasts are red, tender, or warm.  You have   a swollen area on either breast.  You have a fever or chills.  You have nausea or vomiting.  You have drainage other than breast milk from your nipples.  Your breasts do not become full before feedings by the fifth day after you give birth.  You feel sad and depressed.  Your baby is too sleepy to eat well.  Your baby is having trouble sleeping.  Your baby is wetting less than 3 diapers in a 24-hour period.  Your baby has less than 3 stools in a 24-hour period.  Your baby's skin or the white part of his or her eyes becomes yellow.  Your baby is not gaining weight by 5 days of age. Get help right away if:  Your baby is overly tired (lethargic) and does not want to wake up and feed.  Your baby develops an unexplained fever. This information is not intended to replace advice given to you by your health care provider. Make sure you discuss any questions you have with your health care provider. Document Released: 07/19/2005 Document Revised: 12/31/2015 Document Reviewed: 01/10/2013 Elsevier Interactive Patient Education  2017 Elsevier Inc.  

## 2016-09-07 NOTE — Progress Notes (Signed)
   PRENATAL VISIT NOTE  Subjective:  Nicole Moss is a 26 y.o. G1P0000 at 3280w0d being seen today for ongoing prenatal care.  She is currently monitored for the following issues for this high-risk pregnancy and has History of heroin abuse; Supervision of high risk pregnancy, antepartum; History of substance abuse; Insufficient prenatal care; Rubella non-immune status, antepartum; Chronic hepatitis C complicating pregnancy, antepartum (HCC); UTI in pregnancy, antepartum, third trimester; and Methadone use (HCC) on her problem list.  Patient reports no complaints.  Contractions: Not present. Vag. Bleeding: None.  Movement: Present. Denies leaking of fluid.   The following portions of the patient's history were reviewed and updated as appropriate: allergies, current medications, past family history, past medical history, past social history, past surgical history and problem list. Problem list updated.  Objective:   Vitals:   09/06/16 1651  BP: 104/70  Pulse: 90  Weight: 132 lb (59.9 kg)    Fetal Status: Fetal Heart Rate (bpm): 133 Fundal Height: 33 cm Movement: Present     General:  Alert, oriented and cooperative. Patient is in no acute distress.  Skin: Skin is warm and dry. No rash noted.   Cardiovascular: Normal heart rate noted  Respiratory: Normal respiratory effort, no problems with respiration noted  Abdomen: Soft, gravid, appropriate for gestational age. Pain/Pressure: Absent     Pelvic:  Cervical exam deferred        Extremities: Normal range of motion.  Edema: None  Mental Status: Normal mood and affect. Normal behavior. Normal judgment and thought content.   Assessment and Plan:  Pregnancy: G1P0000 at 3980w0d 1. Supervision of high risk pregnancy, antepartum Continue prenatal care.   2. Methadone use (HCC) Has had tour of NICU--continue current dose   Preterm labor symptoms and general obstetric precautions including but not limited to vaginal bleeding, contractions,  leaking of fluid and fetal movement were reviewed in detail with the patient. Please refer to After Visit Summary for other counseling recommendations.  Return in about 2 weeks (around 09/20/2016) for ob visit.   Reva Boresanya S Baptiste Littler, MD

## 2016-09-09 ENCOUNTER — Other Ambulatory Visit (HOSPITAL_COMMUNITY): Payer: Self-pay | Admitting: Maternal and Fetal Medicine

## 2016-09-09 ENCOUNTER — Ambulatory Visit (HOSPITAL_COMMUNITY)
Admission: RE | Admit: 2016-09-09 | Discharge: 2016-09-09 | Disposition: A | Discharge status: Home / Self Care | Payer: Medicaid Other | Source: Ambulatory Visit | Attending: Obstetrics and Gynecology | Admitting: Obstetrics and Gynecology

## 2016-09-09 ENCOUNTER — Encounter (HOSPITAL_COMMUNITY): Payer: Self-pay

## 2016-09-09 DIAGNOSIS — O99323 Drug use complicating pregnancy, third trimester: Secondary | ICD-10-CM | POA: Diagnosis present

## 2016-09-09 DIAGNOSIS — O9989 Other specified diseases and conditions complicating pregnancy, childbirth and the puerperium: Secondary | ICD-10-CM

## 2016-09-09 DIAGNOSIS — O0990 Supervision of high risk pregnancy, unspecified, unspecified trimester: Secondary | ICD-10-CM

## 2016-09-09 DIAGNOSIS — Z283 Underimmunization status: Secondary | ICD-10-CM

## 2016-09-09 DIAGNOSIS — Z2839 Other underimmunization status: Secondary | ICD-10-CM

## 2016-09-09 DIAGNOSIS — Z362 Encounter for other antenatal screening follow-up: Secondary | ICD-10-CM | POA: Diagnosis not present

## 2016-09-09 DIAGNOSIS — B182 Chronic viral hepatitis C: Secondary | ICD-10-CM | POA: Diagnosis not present

## 2016-09-09 DIAGNOSIS — O98413 Viral hepatitis complicating pregnancy, third trimester: Secondary | ICD-10-CM | POA: Diagnosis not present

## 2016-09-09 DIAGNOSIS — F1120 Opioid dependence, uncomplicated: Secondary | ICD-10-CM | POA: Diagnosis present

## 2016-09-09 DIAGNOSIS — Z3A35 35 weeks gestation of pregnancy: Secondary | ICD-10-CM | POA: Diagnosis present

## 2016-09-09 DIAGNOSIS — O98419 Viral hepatitis complicating pregnancy, unspecified trimester: Secondary | ICD-10-CM

## 2016-09-09 DIAGNOSIS — O0932 Supervision of pregnancy with insufficient antenatal care, second trimester: Secondary | ICD-10-CM

## 2016-09-21 ENCOUNTER — Encounter: Payer: Self-pay | Admitting: Obstetrics & Gynecology

## 2016-09-24 ENCOUNTER — Ambulatory Visit (INDEPENDENT_AMBULATORY_CARE_PROVIDER_SITE_OTHER): Payer: Medicaid Other | Admitting: Obstetrics & Gynecology

## 2016-09-24 ENCOUNTER — Other Ambulatory Visit (HOSPITAL_COMMUNITY)
Admission: RE | Admit: 2016-09-24 | Discharge: 2016-09-24 | Disposition: A | Discharge status: Home / Self Care | Payer: Medicaid Other | Source: Ambulatory Visit | Attending: Obstetrics & Gynecology | Admitting: Obstetrics & Gynecology

## 2016-09-24 VITALS — BP 124/81 | HR 83 | Wt 136.2 lb

## 2016-09-24 DIAGNOSIS — Z113 Encounter for screening for infections with a predominantly sexual mode of transmission: Secondary | ICD-10-CM

## 2016-09-24 DIAGNOSIS — O0993 Supervision of high risk pregnancy, unspecified, third trimester: Secondary | ICD-10-CM | POA: Diagnosis present

## 2016-09-24 DIAGNOSIS — F1120 Opioid dependence, uncomplicated: Secondary | ICD-10-CM

## 2016-09-24 DIAGNOSIS — B9689 Other specified bacterial agents as the cause of diseases classified elsewhere: Secondary | ICD-10-CM

## 2016-09-24 DIAGNOSIS — O99323 Drug use complicating pregnancy, third trimester: Secondary | ICD-10-CM

## 2016-09-24 DIAGNOSIS — O219 Vomiting of pregnancy, unspecified: Secondary | ICD-10-CM

## 2016-09-24 DIAGNOSIS — R12 Heartburn: Secondary | ICD-10-CM

## 2016-09-24 DIAGNOSIS — Z3A37 37 weeks gestation of pregnancy: Secondary | ICD-10-CM | POA: Diagnosis not present

## 2016-09-24 DIAGNOSIS — O0990 Supervision of high risk pregnancy, unspecified, unspecified trimester: Secondary | ICD-10-CM

## 2016-09-24 DIAGNOSIS — O26893 Other specified pregnancy related conditions, third trimester: Secondary | ICD-10-CM

## 2016-09-24 DIAGNOSIS — O99613 Diseases of the digestive system complicating pregnancy, third trimester: Secondary | ICD-10-CM

## 2016-09-24 DIAGNOSIS — N760 Acute vaginitis: Secondary | ICD-10-CM

## 2016-09-24 DIAGNOSIS — O2343 Unspecified infection of urinary tract in pregnancy, third trimester: Secondary | ICD-10-CM

## 2016-09-24 DIAGNOSIS — F1190 Opioid use, unspecified, uncomplicated: Secondary | ICD-10-CM

## 2016-09-24 LAB — OB RESULTS CONSOLE GC/CHLAMYDIA: Gonorrhea: NEGATIVE

## 2016-09-24 LAB — OB RESULTS CONSOLE GBS: GBS: NEGATIVE

## 2016-09-24 MED ORDER — ONDANSETRON 4 MG PO TBDP: 4.0000 mg | ORAL_TABLET | Freq: Four times a day (QID) | ORAL | 0 refills | Status: DC | PRN

## 2016-09-24 MED ORDER — FAMOTIDINE 20 MG PO TABS: 20.0000 mg | ORAL_TABLET | Freq: Two times a day (BID) | ORAL | 3 refills | Status: DC

## 2016-09-24 NOTE — Progress Notes (Signed)
   PRENATAL VISIT NOTE  Subjective:  Nicole Moss is a 26 y.o. G1P0000 at 3413w3d being seen today for ongoing prenatal care.  She is currently monitored for the following issues for this high-risk pregnancy and has History of heroin abuse; Supervision of high risk pregnancy, antepartum; History of substance abuse; Insufficient prenatal care; Rubella non-immune status, antepartum; Chronic hepatitis C complicating pregnancy, antepartum (HCC); UTI in pregnancy, antepartum, third trimester; and Methadone use (HCC) on her problem list.  Patient reports heartburn, nausea and pelvic pressure.  Contractions: Not present. Vag. Bleeding: None.  Movement: Present. Denies leaking of fluid.   The following portions of the patient's history were reviewed and updated as appropriate: allergies, current medications, past family history, past medical history, past social history, past surgical history and problem list. Problem list updated.  Objective:   Vitals:   09/24/16 0901  BP: 124/81  Pulse: 83  Weight: 136 lb 3.2 oz (61.8 kg)    Fetal Status: Fetal Heart Rate (bpm): 135 Fundal Height: 37 cm Movement: Present  Presentation: Vertex  General:  Alert, oriented and cooperative. Patient is in no acute distress.  Skin: Skin is warm and dry. No rash noted.   Cardiovascular: Normal heart rate noted  Respiratory: Normal respiratory effort, no problems with respiration noted  Abdomen: Soft, gravid, appropriate for gestational age. Pain/Pressure: Absent     Pelvic:  Cervical exam performed Dilation: Closed Effacement (%): Thick Station: Ballotable  Extremities: Normal range of motion.  Edema: None  Mental Status: Normal mood and affect. Normal behavior. Normal judgment and thought content.   Assessment and Plan:  Pregnancy: G1P0000 at 6213w3d  1. Methadone use (HCC) Doing well on methadone. Surveillance UDS to be checked today. - ToxASSURE Select 13 (MW), Urine  2. Heartburn during pregnancy in third  trimester Pepcid prescribed as needed - famotidine (PEPCID) 20 MG tablet; Take 1 tablet (20 mg total) by mouth 2 (two) times daily.  Dispense: 60 tablet; Refill: 3  3. Nausea and vomiting during pregnancy Zofran as needed - ondansetron (ZOFRAN ODT) 4 MG disintegrating tablet; Take 1 tablet (4 mg total) by mouth every 6 (six) hours as needed for nausea.  Dispense: 20 tablet; Refill: 0  4. UTI in pregnancy, antepartum, third trimester Surveillance culture done; on Keflex suppression therapy given recurrent UTIs. - Culture, OB Urine  5. Supervision of high risk pregnancy, antepartum Pelvic cultures done today. - Strep Gp B Culture+Rflx - Cervicovaginal ancillary only Term labor symptoms and general obstetric precautions including but not limited to vaginal bleeding, contractions, leaking of fluid and fetal movement were reviewed in detail with the patient. Please refer to After Visit Summary for other counseling recommendations.  Return in about 1 week (around 10/01/2016) for OB Visit.   Tereso NewcomerUgonna A Dontay Harm, MD

## 2016-09-24 NOTE — Patient Instructions (Signed)
Return to clinic for any scheduled appointments or obstetric concerns, or go to [facility]U for evaluation  

## 2016-09-27 LAB — CERVICOVAGINAL ANCILLARY ONLY
Bacterial vaginitis: POSITIVE — AB
Candida vaginitis: NEGATIVE
Chlamydia: NEGATIVE
Neisseria Gonorrhea: NEGATIVE
Trichomonas: NEGATIVE

## 2016-09-28 ENCOUNTER — Telehealth: Payer: Self-pay | Admitting: *Deleted

## 2016-09-28 LAB — STREP GP B CULTURE+RFLX: STREP GP B CULTURE+RFLX: NEGATIVE

## 2016-09-28 MED ORDER — METRONIDAZOLE 500 MG PO TABS: 500.0000 mg | ORAL_TABLET | Freq: Two times a day (BID) | ORAL | 0 refills | Status: DC

## 2016-09-28 NOTE — Telephone Encounter (Signed)
Informed pt of results and instructed on medication use.  Pt acknowledged instructions.

## 2016-09-28 NOTE — Addendum Note (Signed)
Addended by: Jaynie CollinsANYANWU, Marilu Rylander A on: 09/28/2016 02:23 PM   Modules accepted: Orders

## 2016-09-28 NOTE — Telephone Encounter (Signed)
-----   Message from Tereso NewcomerUgonna A Anyanwu, MD sent at 09/28/2016  2:23 PM EST ----- Vaginal discharge test is abnormal and showed bacterial vaginitis. Metronidazole was prescribed. Please inform patient of results and advise to pick up prescription.

## 2016-09-30 LAB — TOXASSURE SELECT 13 (MW), URINE

## 2016-10-01 ENCOUNTER — Ambulatory Visit (INDEPENDENT_AMBULATORY_CARE_PROVIDER_SITE_OTHER): Payer: Medicaid Other | Admitting: Obstetrics and Gynecology

## 2016-10-01 VITALS — BP 117/81 | HR 92 | Wt 136.0 lb

## 2016-10-01 DIAGNOSIS — O9989 Other specified diseases and conditions complicating pregnancy, childbirth and the puerperium: Secondary | ICD-10-CM

## 2016-10-01 DIAGNOSIS — O0933 Supervision of pregnancy with insufficient antenatal care, third trimester: Secondary | ICD-10-CM

## 2016-10-01 DIAGNOSIS — O09899 Supervision of other high risk pregnancies, unspecified trimester: Secondary | ICD-10-CM

## 2016-10-01 DIAGNOSIS — O0990 Supervision of high risk pregnancy, unspecified, unspecified trimester: Secondary | ICD-10-CM

## 2016-10-01 DIAGNOSIS — F1911 Other psychoactive substance abuse, in remission: Secondary | ICD-10-CM

## 2016-10-01 DIAGNOSIS — Z283 Underimmunization status: Secondary | ICD-10-CM

## 2016-10-01 DIAGNOSIS — B182 Chronic viral hepatitis C: Secondary | ICD-10-CM

## 2016-10-01 DIAGNOSIS — O98413 Viral hepatitis complicating pregnancy, third trimester: Secondary | ICD-10-CM

## 2016-10-01 DIAGNOSIS — O98419 Viral hepatitis complicating pregnancy, unspecified trimester: Secondary | ICD-10-CM

## 2016-10-01 LAB — CULTURE, OB URINE

## 2016-10-01 LAB — URINE CULTURE, OB REFLEX

## 2016-10-01 MED ORDER — CEPHALEXIN 500 MG PO CAPS: 500.0000 mg | ORAL_CAPSULE | Freq: Four times a day (QID) | ORAL | 0 refills | Status: DC

## 2016-10-01 NOTE — Progress Notes (Signed)
   PRENATAL VISIT NOTE  Subjective:  Nicole Moss is a 26 y.o. G1P0000 at 248w3d being seen today for ongoing prenatal care.  She is currently monitored for the following issues for this high-risk pregnancy and has History of heroin abuse; Supervision of high risk pregnancy, antepartum; History of substance abuse; Insufficient prenatal care; Rubella non-immune status, antepartum; Chronic hepatitis C complicating pregnancy, antepartum (HCC); UTI in pregnancy, antepartum, third trimester; and Methadone use (HCC) on her problem list.  Patient reports no complaints.  Contractions: Not present. Vag. Bleeding: None.  Movement: Present. Denies leaking of fluid.   The following portions of the patient's history were reviewed and updated as appropriate: allergies, current medications, past family history, past medical history, past social history, past surgical history and problem list. Problem list updated.  Objective:   Vitals:   10/01/16 0921  BP: 117/81  Pulse: 92  Weight: 136 lb (61.7 kg)    Fetal Status: Fetal Heart Rate (bpm): 142 Fundal Height: 38 cm Movement: Present  Presentation: Vertex  General:  Alert, oriented and cooperative. Patient is in no acute distress.  Skin: Skin is warm and dry. No rash noted.   Cardiovascular: Normal heart rate noted  Respiratory: Normal respiratory effort, no problems with respiration noted  Abdomen: Soft, gravid, appropriate for gestational age. Pain/Pressure: Present     Pelvic:  Cervical exam performed Dilation: Closed Effacement (%): Thick Station: Ballotable  Extremities: Normal range of motion.  Edema: Trace  Mental Status: Normal mood and affect. Normal behavior. Normal judgment and thought content.   Assessment and Plan:  Pregnancy: G1P0000 at 648w3d  1. Supervision of high risk pregnancy, antepartum Patient is doing well without complaints 2/23 ob urine Cx + E. Coli- Rx Keflex provided Normal growth on 2/8  2. History of substance  abuse On methadone 2/23 UDS- + methadone and THC  3. Rubella non-immune status, antepartum Will offer pp  4. Insufficient prenatal care in third trimester   5. Chronic hepatitis C complicating pregnancy, antepartum (HCC)   Term labor symptoms and general obstetric precautions including but not limited to vaginal bleeding, contractions, leaking of fluid and fetal movement were reviewed in detail with the patient. Please refer to After Visit Summary for other counseling recommendations.  Return in about 1 week (around 10/08/2016).   Catalina AntiguaPeggy Lesieli Bresee, MD

## 2016-10-08 ENCOUNTER — Ambulatory Visit (INDEPENDENT_AMBULATORY_CARE_PROVIDER_SITE_OTHER): Payer: Medicaid Other | Admitting: Obstetrics & Gynecology

## 2016-10-08 VITALS — BP 123/83 | HR 91 | Wt 134.0 lb

## 2016-10-08 DIAGNOSIS — O0990 Supervision of high risk pregnancy, unspecified, unspecified trimester: Secondary | ICD-10-CM

## 2016-10-08 DIAGNOSIS — O2343 Unspecified infection of urinary tract in pregnancy, third trimester: Secondary | ICD-10-CM

## 2016-10-08 DIAGNOSIS — F1190 Opioid use, unspecified, uncomplicated: Secondary | ICD-10-CM

## 2016-10-08 DIAGNOSIS — O99323 Drug use complicating pregnancy, third trimester: Secondary | ICD-10-CM

## 2016-10-08 DIAGNOSIS — F1120 Opioid dependence, uncomplicated: Secondary | ICD-10-CM

## 2016-10-08 NOTE — Patient Instructions (Signed)
Return to clinic for any scheduled appointments or obstetric concerns, or go to [facility]U for evaluation  

## 2016-10-08 NOTE — Progress Notes (Signed)
   PRENATAL VISIT NOTE  Subjective:  Nicole Moss is a 26 y.o. G1P0000 at 5348w3d being seen today for ongoing prenatal care.  She is currently monitored for the following issues for this high-risk pregnancy and has History of heroin abuse; Supervision of high risk pregnancy, antepartum; History of substance abuse; Insufficient prenatal care; Rubella non-immune status, antepartum; Chronic hepatitis C complicating pregnancy, antepartum (HCC); UTI in pregnancy, antepartum, third trimester; and Methadone use (HCC) on her problem list.  Patient reports no complaints.  Contractions: Irritability. Vag. Bleeding: None.  Movement: Present. Denies leaking of fluid.   The following portions of the patient's history were reviewed and updated as appropriate: allergies, current medications, past family history, past medical history, past social history, past surgical history and problem list. Problem list updated.  Objective:   Vitals:   10/08/16 1144  BP: 123/83  Pulse: 91  Weight: 134 lb (60.8 kg)    Fetal Status: Fetal Heart Rate (bpm): 130 Fundal Height: 39 cm Movement: Present     General:  Alert, oriented and cooperative. Patient is in no acute distress.  Skin: Skin is warm and dry. No rash noted.   Cardiovascular: Normal heart rate noted  Respiratory: Normal respiratory effort, no problems with respiration noted  Abdomen: Soft, gravid, appropriate for gestational age. Pain/Pressure: Absent     Pelvic:  Cervical exam deferred        Extremities: Normal range of motion.  Edema: Trace  Mental Status: Normal mood and affect. Normal behavior. Normal judgment and thought content.   Assessment and Plan:  Pregnancy: G1P0000 at 10448w3d  1. Methadone use (HCC) Continue Methadone. Already had NICU tour.   2. UTI in pregnancy, antepartum, third trimester Continue Keflex suppression.  3. Supervision of high risk pregnancy, antepartum Term labor symptoms and general obstetric precautions including  but not limited to vaginal bleeding, contractions, leaking of fluid and fetal movement were reviewed in detail with the patient. Please refer to After Visit Summary for other counseling recommendations.  Return in about 1 week (around 10/15/2016) for OB Visit, NST, AFI  10/18/16 NST only.   Tereso NewcomerUgonna A Kinsleigh Ludolph, MD

## 2016-10-13 ENCOUNTER — Inpatient Hospital Stay (HOSPITAL_COMMUNITY)
Admission: AD | Admit: 2016-10-13 | Discharge: 2016-10-15 | DRG: 774 | Disposition: A | Discharge status: Home / Self Care | Payer: Medicaid Other | Source: Ambulatory Visit | Attending: Family Medicine | Admitting: Family Medicine

## 2016-10-13 ENCOUNTER — Inpatient Hospital Stay (HOSPITAL_COMMUNITY): Payer: Medicaid Other | Admitting: Anesthesiology

## 2016-10-13 ENCOUNTER — Encounter (HOSPITAL_COMMUNITY): Payer: Self-pay | Admitting: *Deleted

## 2016-10-13 DIAGNOSIS — O9842 Viral hepatitis complicating childbirth: Secondary | ICD-10-CM | POA: Diagnosis present

## 2016-10-13 DIAGNOSIS — F1120 Opioid dependence, uncomplicated: Secondary | ICD-10-CM | POA: Diagnosis present

## 2016-10-13 DIAGNOSIS — K219 Gastro-esophageal reflux disease without esophagitis: Secondary | ICD-10-CM | POA: Diagnosis present

## 2016-10-13 DIAGNOSIS — O9962 Diseases of the digestive system complicating childbirth: Secondary | ICD-10-CM | POA: Diagnosis present

## 2016-10-13 DIAGNOSIS — O0933 Supervision of pregnancy with insufficient antenatal care, third trimester: Secondary | ICD-10-CM

## 2016-10-13 DIAGNOSIS — F17210 Nicotine dependence, cigarettes, uncomplicated: Secondary | ICD-10-CM | POA: Diagnosis present

## 2016-10-13 DIAGNOSIS — O0990 Supervision of high risk pregnancy, unspecified, unspecified trimester: Secondary | ICD-10-CM

## 2016-10-13 DIAGNOSIS — F1910 Other psychoactive substance abuse, uncomplicated: Secondary | ICD-10-CM | POA: Diagnosis present

## 2016-10-13 DIAGNOSIS — O99324 Drug use complicating childbirth: Secondary | ICD-10-CM | POA: Diagnosis present

## 2016-10-13 DIAGNOSIS — Z3A40 40 weeks gestation of pregnancy: Secondary | ICD-10-CM

## 2016-10-13 DIAGNOSIS — B182 Chronic viral hepatitis C: Secondary | ICD-10-CM

## 2016-10-13 DIAGNOSIS — Z283 Underimmunization status: Secondary | ICD-10-CM

## 2016-10-13 DIAGNOSIS — Z3493 Encounter for supervision of normal pregnancy, unspecified, third trimester: Secondary | ICD-10-CM | POA: Diagnosis present

## 2016-10-13 DIAGNOSIS — O99334 Smoking (tobacco) complicating childbirth: Secondary | ICD-10-CM | POA: Diagnosis present

## 2016-10-13 DIAGNOSIS — O09899 Supervision of other high risk pregnancies, unspecified trimester: Secondary | ICD-10-CM

## 2016-10-13 DIAGNOSIS — O9989 Other specified diseases and conditions complicating pregnancy, childbirth and the puerperium: Secondary | ICD-10-CM

## 2016-10-13 DIAGNOSIS — O98419 Viral hepatitis complicating pregnancy, unspecified trimester: Secondary | ICD-10-CM

## 2016-10-13 LAB — CBC
HCT: 38.1 % (ref 36.0–46.0)
HEMOGLOBIN: 13.1 g/dL (ref 12.0–15.0)
MCH: 32.2 pg (ref 26.0–34.0)
MCHC: 34.4 g/dL (ref 30.0–36.0)
MCV: 93.6 fL (ref 78.0–100.0)
Platelets: 216 10*3/uL (ref 150–400)
RBC: 4.07 MIL/uL (ref 3.87–5.11)
RDW: 13.7 % (ref 11.5–15.5)
WBC: 11.8 10*3/uL — ABNORMAL HIGH (ref 4.0–10.5)

## 2016-10-13 LAB — TYPE AND SCREEN
ABO/RH(D): A POS
ANTIBODY SCREEN: NEGATIVE

## 2016-10-13 LAB — RPR: RPR Ser Ql: NONREACTIVE

## 2016-10-13 LAB — RAPID URINE DRUG SCREEN, HOSP PERFORMED
AMPHETAMINES: NOT DETECTED
BARBITURATES: NOT DETECTED
Benzodiazepines: NOT DETECTED
COCAINE: NOT DETECTED
OPIATES: NOT DETECTED
TETRAHYDROCANNABINOL: POSITIVE — AB

## 2016-10-13 LAB — ABO/RH: ABO/RH(D): A POS

## 2016-10-13 MED ORDER — TETANUS-DIPHTH-ACELL PERTUSSIS 5-2.5-18.5 LF-MCG/0.5 IM SUSP
0.5000 mL | Freq: Once | INTRAMUSCULAR | Status: DC
  Filled 2016-10-13: qty 0.5

## 2016-10-13 MED ORDER — ACETAMINOPHEN 325 MG PO TABS
650.0000 mg | ORAL_TABLET | ORAL | Status: DC | PRN
  Administered 2016-10-13 – 2016-10-14 (×2): 650 mg via ORAL
  Filled 2016-10-13 (×2): qty 2

## 2016-10-13 MED ORDER — METHADONE HCL 10 MG/ML PO CONC
120.0000 mg | Freq: Every day | ORAL | Status: DC
  Administered 2016-10-14 – 2016-10-15 (×2): 120 mg via ORAL
  Filled 2016-10-13 (×2): qty 12

## 2016-10-13 MED ORDER — EPHEDRINE 5 MG/ML INJ: 10.0000 mg | INTRAVENOUS | Status: DC | PRN

## 2016-10-13 MED ORDER — OXYCODONE HCL 5 MG PO TABS
5.0000 mg | ORAL_TABLET | ORAL | Status: DC | PRN
  Administered 2016-10-13: 5 mg via ORAL
  Filled 2016-10-13: qty 1

## 2016-10-13 MED ORDER — LACTATED RINGERS IV SOLN: 500.0000 mL | INTRAVENOUS | Status: DC | PRN

## 2016-10-13 MED ORDER — ONDANSETRON HCL 4 MG/2ML IJ SOLN: 4.0000 mg | Freq: Four times a day (QID) | INTRAMUSCULAR | Status: DC | PRN

## 2016-10-13 MED ORDER — PRENATAL MULTIVITAMIN CH
1.0000 | ORAL_TABLET | Freq: Every day | ORAL | Status: DC
  Administered 2016-10-14: 1 via ORAL
  Filled 2016-10-13: qty 1

## 2016-10-13 MED ORDER — OXYCODONE-ACETAMINOPHEN 5-325 MG PO TABS: 2.0000 | ORAL_TABLET | ORAL | Status: DC | PRN

## 2016-10-13 MED ORDER — WITCH HAZEL-GLYCERIN EX PADS: 1.0000 "application " | MEDICATED_PAD | CUTANEOUS | Status: DC | PRN

## 2016-10-13 MED ORDER — OXYCODONE HCL 5 MG PO TABS: 10.0000 mg | ORAL_TABLET | ORAL | Status: DC | PRN

## 2016-10-13 MED ORDER — METHADONE HCL 40 MG PO TBSO: 120.0000 mg | ORAL_TABLET | Freq: Every day | ORAL | Status: DC

## 2016-10-13 MED ORDER — FENTANYL CITRATE (PF) 100 MCG/2ML IJ SOLN
INTRAMUSCULAR | Status: AC
  Filled 2016-10-13: qty 2

## 2016-10-13 MED ORDER — ACETAMINOPHEN 325 MG PO TABS: 650.0000 mg | ORAL_TABLET | ORAL | Status: DC | PRN

## 2016-10-13 MED ORDER — IBUPROFEN 600 MG PO TABS
600.0000 mg | ORAL_TABLET | Freq: Four times a day (QID) | ORAL | Status: DC
  Administered 2016-10-14 – 2016-10-15 (×6): 600 mg via ORAL
  Filled 2016-10-13 (×7): qty 1

## 2016-10-13 MED ORDER — DIBUCAINE 1 % RE OINT: 1.0000 "application " | TOPICAL_OINTMENT | RECTAL | Status: DC | PRN

## 2016-10-13 MED ORDER — PHENYLEPHRINE 40 MCG/ML (10ML) SYRINGE FOR IV PUSH (FOR BLOOD PRESSURE SUPPORT): 80.0000 ug | PREFILLED_SYRINGE | INTRAVENOUS | Status: DC | PRN

## 2016-10-13 MED ORDER — ZOLPIDEM TARTRATE 5 MG PO TABS: 5.0000 mg | ORAL_TABLET | Freq: Every evening | ORAL | Status: DC | PRN

## 2016-10-13 MED ORDER — BENZOCAINE-MENTHOL 20-0.5 % EX AERO
1.0000 "application " | INHALATION_SPRAY | CUTANEOUS | Status: DC | PRN
  Administered 2016-10-13: 1 via TOPICAL
  Filled 2016-10-13: qty 56

## 2016-10-13 MED ORDER — MEASLES, MUMPS & RUBELLA VAC ~~LOC~~ INJ
0.5000 mL | INJECTION | Freq: Once | SUBCUTANEOUS | Status: DC
  Filled 2016-10-13 (×2): qty 0.5

## 2016-10-13 MED ORDER — ONDANSETRON HCL 4 MG PO TABS
4.0000 mg | ORAL_TABLET | ORAL | Status: DC | PRN
  Filled 2016-10-13: qty 1

## 2016-10-13 MED ORDER — FENTANYL 2.5 MCG/ML BUPIVACAINE 1/10 % EPIDURAL INFUSION (WH - ANES)
14.0000 mL/h | INTRAMUSCULAR | Status: DC | PRN
  Administered 2016-10-13 (×2): 14 mL/h via EPIDURAL
  Filled 2016-10-13 (×2): qty 100

## 2016-10-13 MED ORDER — LIDOCAINE HCL (PF) 1 % IJ SOLN
30.0000 mL | INTRAMUSCULAR | Status: DC | PRN
  Administered 2016-10-13: 30 mL via SUBCUTANEOUS
  Filled 2016-10-13: qty 30

## 2016-10-13 MED ORDER — METHADONE HCL 10 MG/ML PO CONC
120.0000 mg | Freq: Every day | ORAL | Status: DC
  Administered 2016-10-13: 120 mg via ORAL
  Filled 2016-10-13 (×2): qty 12

## 2016-10-13 MED ORDER — SENNOSIDES-DOCUSATE SODIUM 8.6-50 MG PO TABS
2.0000 | ORAL_TABLET | ORAL | Status: DC
  Administered 2016-10-14: 2 via ORAL
  Filled 2016-10-13 (×2): qty 2

## 2016-10-13 MED ORDER — LACTATED RINGERS IV SOLN
INTRAVENOUS | Status: DC
  Administered 2016-10-13 (×2): via INTRAVENOUS

## 2016-10-13 MED ORDER — LIDOCAINE HCL (PF) 1 % IJ SOLN
INTRAMUSCULAR | Status: DC | PRN
  Administered 2016-10-13: 4 mL via EPIDURAL
  Administered 2016-10-13: 5 mL via EPIDURAL

## 2016-10-13 MED ORDER — DIPHENHYDRAMINE HCL 50 MG/ML IJ SOLN: 12.5000 mg | INTRAMUSCULAR | Status: DC | PRN

## 2016-10-13 MED ORDER — FENTANYL CITRATE (PF) 100 MCG/2ML IJ SOLN
50.0000 ug | INTRAMUSCULAR | Status: DC | PRN
  Administered 2016-10-13: 100 ug via INTRAVENOUS

## 2016-10-13 MED ORDER — OXYTOCIN 40 UNITS IN LACTATED RINGERS INFUSION - SIMPLE MED
2.5000 [IU]/h | INTRAVENOUS | Status: DC
  Filled 2016-10-13: qty 1000

## 2016-10-13 MED ORDER — ONDANSETRON HCL 4 MG/2ML IJ SOLN
4.0000 mg | INTRAMUSCULAR | Status: DC | PRN
  Administered 2016-10-13: 4 mg via INTRAVENOUS
  Filled 2016-10-13: qty 2

## 2016-10-13 MED ORDER — OXYCODONE-ACETAMINOPHEN 5-325 MG PO TABS: 1.0000 | ORAL_TABLET | ORAL | Status: DC | PRN

## 2016-10-13 MED ORDER — LACTATED RINGERS IV SOLN: 500.0000 mL | Freq: Once | INTRAVENOUS | Status: DC

## 2016-10-13 MED ORDER — COCONUT OIL OIL: 1.0000 "application " | TOPICAL_OIL | Status: DC | PRN

## 2016-10-13 MED ORDER — FLEET ENEMA 7-19 GM/118ML RE ENEM: 1.0000 | ENEMA | RECTAL | Status: DC | PRN

## 2016-10-13 MED ORDER — OXYTOCIN BOLUS FROM INFUSION
500.0000 mL | Freq: Once | INTRAVENOUS | Status: AC
  Administered 2016-10-13: 500 mL via INTRAVENOUS

## 2016-10-13 MED ORDER — SOD CITRATE-CITRIC ACID 500-334 MG/5ML PO SOLN: 30.0000 mL | ORAL | Status: DC | PRN

## 2016-10-13 MED ORDER — PHENYLEPHRINE 40 MCG/ML (10ML) SYRINGE FOR IV PUSH (FOR BLOOD PRESSURE SUPPORT)
80.0000 ug | PREFILLED_SYRINGE | INTRAVENOUS | Status: DC | PRN
  Filled 2016-10-13: qty 10

## 2016-10-13 MED ORDER — SIMETHICONE 80 MG PO CHEW: 80.0000 mg | CHEWABLE_TABLET | ORAL | Status: DC | PRN

## 2016-10-13 MED ORDER — DIPHENHYDRAMINE HCL 25 MG PO CAPS: 25.0000 mg | ORAL_CAPSULE | Freq: Four times a day (QID) | ORAL | Status: DC | PRN

## 2016-10-13 NOTE — Progress Notes (Signed)
LABOR PROGRESS NOTE  Subjective: Comfortable, napping some. Episode of vomiting x1 about 1 hr ago, no further n/v since.  Objective: BP 136/90   Pulse 67   Temp 98 F (36.7 C) (Oral)   Resp 16   Ht 5\' 7"  (1.702 m)   Wt 137 lb (62.1 kg)   LMP 02/01/2016   BMI 21.46 kg/m    @ 1100 Dilation: 7 Effacement (%): 100 Cervical Position: Posterior Station: 0 Presentation: Vertex Exam by:: Ferne CoeS Earl RNC  Assessment / Plan: 26 y.o. G1P0000 at 7447w1d here for SOL  Labor: spontaneous, recheck at 1PM.  Will AROM if not progressing, but currently progressing normally on her own. Fetal Wellbeing:  Category I Pain Control:  epidural Anticipated MOD:  vaginal UDS pending  Charlsie MerlesJulia Danelly Hassinger, MD 10/13/2016, 11:51 AM

## 2016-10-13 NOTE — H&P (Signed)
LABOR AND DELIVERY ADMISSION HISTORY AND PHYSICAL NOTE  Nicole Moss is a 26 y.o. female G1P0000 with IUP at [redacted]w[redacted]d by LMP presenting for SOL. Pt says she has been contracting with abdominal pain since 05:00. Sees [facility] for prenatal care. Was seen last week with closed cervix, and has now presented to [facility] with dilated cervix 5 cm and 100 effacement. Was late to prenatal care, endorses h/o cocaine and heroin use on methadone but not used in months per pt. She reports positive fetal movement.   Prenatal History/Complications: Patient Active Problem List   Diagnosis Date Noted  . UTI in pregnancy, antepartum, third trimester 07/22/2016  . Methadone use (HCC) 07/22/2016  . Chronic hepatitis C complicating pregnancy, antepartum (HCC) 07/01/2016  . Rubella non-immune status, antepartum 06/23/2016  . Supervision of high risk pregnancy, antepartum 06/22/2016  . History of substance abuse 06/22/2016  . Insufficient prenatal care 06/22/2016  . History of heroin abuse 03/15/2013   Past Medical History: Past Medical History:  Diagnosis Date  . Addiction to drug (HCC)   . Anorexia   . Hepatitis C 06/23/2016  . Opioid abuse 03/15/2013  . Polysubstance dependence including opioid type drug, episodic abuse (HCC) 03/15/2013    Past Surgical History: Past Surgical History:  Procedure Laterality Date  . WISDOM TOOTH EXTRACTION      Obstetrical History: OB History    Gravida Para Term Preterm AB Living   1 0 0 0 0 0   SAB TAB Ectopic Multiple Live Births   0 0 0 0        Social History: Social History   Social History  . Marital status: Single    Spouse name: N/A  . Number of children: N/A  . Years of education: N/A   Social History Main Topics  . Smoking status: Current Every Day Smoker  . Smokeless tobacco: Never Used  . Alcohol use No  . Drug use: Yes    Types: Marijuana, Heroin     Comment: heroin/opiates  . Sexual activity: Yes    Partners: Male    Birth control/  protection: None   Other Topics Concern  . None   Social History Narrative  . None    Family History: Family History  Problem Relation Age of Onset  . Throat cancer Father     Allergies: Allergies  Allergen Reactions  . Penicillins Nausea And Vomiting    Has patient had a PCN reaction causing immediate rash, facial/tongue/throat swelling, SOB or lightheadedness with hypotension: No Has patient had a PCN reaction causing severe rash involving mucus membranes or skin necrosis: No Has patient had a PCN reaction that required hospitalization No Has patient had a PCN reaction occurring within the last 10 years: Yes If all of the above answers are "NO", then may proceed with Cephalosporin use.   . Sulfa Antibiotics Nausea And Vomiting    Prescriptions Prior to Admission  Medication Sig Dispense Refill Last Dose  . bismuth subsalicylate (PEPTO BISMOL) 262 MG/15ML suspension Take 30 mLs by mouth every 6 (six) hours as needed for indigestion or diarrhea or loose stools.   Taking  . calcium carbonate (TUMS - DOSED IN MG ELEMENTAL CALCIUM) 500 MG chewable tablet Chew 1 tablet by mouth daily.   Taking  . cephALEXin (KEFLEX) 250 MG capsule 500mg  po x 7d and then 250mg  po qhs for the rest of pregnancy to prevent UTIs in the future 70 capsule 1 Taking  . cephALEXin (KEFLEX) 500 MG capsule Take  1 capsule (500 mg total) by mouth 4 (four) times daily. 28 capsule 0 Taking  . famotidine (PEPCID) 20 MG tablet Take 1 tablet (20 mg total) by mouth 2 (two) times daily. 60 tablet 3 Taking  . methadone (METHADOSE) 40 MG disintegrating tablet Take 120 mg by mouth daily.    Taking  . metroNIDAZOLE (FLAGYL) 500 MG tablet Take 1 tablet (500 mg total) by mouth 2 (two) times daily. 14 tablet 0 Taking  . ondansetron (ZOFRAN ODT) 4 MG disintegrating tablet Take 1 tablet (4 mg total) by mouth every 6 (six) hours as needed for nausea. 20 tablet 0 Taking  . Prenat w/o A Vit-FeFum-FePo-FA (CONCEPT OB) 130-92.4-1 MG  CAPS Take 1 capsule by mouth daily. 30 capsule 11 Taking     Review of Systems   All systems reviewed and negative except as stated in HPI  Blood pressure 134/68, pulse 91, temperature 97.3 F (36.3 C), temperature source Oral, resp. rate 24, last menstrual period 02/01/2016. General appearance: alert, cooperative and mild distress in labor Lungs: clear to auscultation bilaterally Heart: regular rate and rhythm Abdomen: soft, non-tender; bowel sounds normal Extremities: No calf swelling or tenderness Presentation: cephalic Fetal monitoring: FHR 135 bpm, moderate variability, accelerations present, no decelerations Uterine activity: irregular q 3-5 mins Dilation: 5 Effacement (%): 100 Station: -1 Exam by:: VERONICA    Prenatal labs: ABO, Rh: A/POS/-- (11/21 1610) Antibody: NEG (11/21 1610) Rubella: None-Immune RPR: NON REAC (12/21 0932)  HBsAg: NEGATIVE (11/21 1610)  HIV: NONREACTIVE (12/21 0932)  GBS: Negative (02/23 0000)  1 hr Glucola: Normal (100) Genetic screening:  Late prenatal Anatomy US: Normal  Prenatal Transfer Tool  Maternal Diabetes: No Genetic Screening: Late prenatal Maternal Ultrasounds/Referrals: Normal Fetal Ultrasounds or other Referrals:  None Maternal Substance Abuse:  Yes:  Type: Cocaine, Methadone Significant Maternal Medications:  Meds include: Other: Methadone Significant Maternal Lab Results: Lab values include: Group B Strep negative  No results found for this or any previous visit (from the past 24 hour(s)).  Patient Active Problem List   Diagnosis Date Noted  . UTI in pregnancy, antepartum, third trimester 07/22/2016  . Methadone use (HCC) 07/22/2016  . Chronic hepatitis C complicating pregnancy, antepartum (HCC) 07/01/2016  . Rubella non-immune status, antepartum 06/23/2016  . Supervision of high risk pregnancy, antepartum 06/22/2016  . History of substance abuse 06/22/2016  . Insufficient prenatal care 06/22/2016  . History of  heroin abuse 03/15/2013    Assessment: Nicole Moss is a 26 y.o. G1P0000 at 7530w1d here for SOL.  #Labor:SOL #Pain: Epidural available #FWB: Category I #ID:  GBS negative #MOF: Bottle #MOC:IUD #Circ:  N/A  Wendee Beaversavid J McMullen, DO, PGY-1 10/13/2016, 6:22 AM   I spoke with and examined patient and agree with resident/PA/SNM's note and plan of care.  G1P0000 @ 8430w1d w/ spontaneous labor.  Late to care @ 24wks, uds during pregnancy + for cocaine, thc,opiates, prior heroin use, methadone 70mg  daily [facility] Chronic hep c Expectant management SW consult pp  Cheral MarkerKimberly R. Kit Brubacher, CNM, [facility]NP-BC 10/13/2016 8:50 AM

## 2016-10-13 NOTE — Anesthesia Pain Management Evaluation Note (Signed)
  CRNA Pain Management Visit Note  Patient: Nicole Moss, 26 y.o., female  "Hello I am a [facility] of the anesthesia [facility]. We have an anesthesia team available at all times to provide care throughout the hospital, including epidural management and anesthesia for C-section. I don't know your plan for the delivery whether it a natural birth, water birth, IV sedation, nitrous supplementation, doula or epidural, but we want to meet your pain goals."   1.Was your pain managed to your expectations on prior hospitalizations?   No prior hospitalizations  2.What is your expectation for pain management during this hospitalization?     Epidural  3.How can we help you reach that goal? Maintain epidural.  Record the patient's initial score and the patient's pain goal.   Pain: 4 Patient states that this is not pain, only pressure.  Pain Goal: 7 [facility] wants you to be able to say your pain was always managed very well.  Nicole Moss 10/13/2016

## 2016-10-13 NOTE — Progress Notes (Signed)
Nicole MulletKatlyn Moss is a 26 y.o. G1P0000 at 5965w1d by ultrasound admitted for active labor  Subjective: Pt resting comfortably. No complaints.    Objective: BP 136/90   Pulse 67   Temp 98 F (36.7 C) (Oral)   Resp 16   Ht 5\' 7"  (1.702 m)   Wt 62.1 kg (137 lb)   LMP 02/01/2016   BMI 21.46 kg/m  No intake/output data recorded. No intake/output data recorded.  FHT:  FHR: 125 bpm, variability: moderate,  accelerations:  Present,  decelerations:  Absent UC:   regular, every 2-4 minutes SVE:   Dilation: 7 Effacement (%): 100 Station: 0 Exam by:: Campbell SoupS Earl RNC  Labs: Lab Results  Component Value Date   WBC 11.8 (H) 10/13/2016   HGB 13.1 10/13/2016   HCT 38.1 10/13/2016   MCV 93.6 10/13/2016   PLT 216 10/13/2016    Assessment / Plan: Spontaneous labor, progressing normally  Labor: Progressing normally Preeclampsia:  none Fetal Wellbeing:  Category I Pain Control:  Epidural I/D:  n/a Anticipated MOD:  NSVD  [name]  PA-Student  10/13/2016, 1:15 PM

## 2016-10-13 NOTE — Progress Notes (Signed)
LABOR PROGRESS NOTE  Subjective: Much more comfortable with epidural, still feeling contractions.  Objective: BP 136/90   Pulse 67   Temp 98 F (36.7 C) (Oral)   Resp 16   Ht 5\' 7"  (1.702 m)   Wt 137 lb (62.1 kg)   LMP 02/01/2016   BMI 21.46 kg/m    0830: Dilation: 5 Effacement (%): 90 Cervical Position: Posterior Station: -1 Presentation: Vertex Exam by:: Ferne CoeS Earl RNC  Assessment / Plan: 26 y.o. G1P0000 at 6413w1d here for SOL  Labor: spontaneous, recheck in 2 hours or with pressure Fetal Wellbeing:  Category I Pain Control:  epidural Anticipated MOD:  vaginal UDS pending  Charlsie MerlesJulia Rhoden, MD 10/13/2016, 9:37 AM

## 2016-10-13 NOTE — Progress Notes (Signed)
Comfortable.  FHT:  Category II - minimal variability baseline 122.  Reassured by accel with scalp stim after AROM. SVE: 8/100/0 > AROM > 9/100/0 Toco: q3 min  Plan:  Expectant management toward vaginal delivery.  Charlsie MerlesJulia Karem Farha, MD 10/13/16 13:20

## 2016-10-13 NOTE — MAU Note (Addendum)
PT  SAYS UC  HURT BAD  SINCE  0500.     [facility]    [facility]-   [facility] - LAST WEEK -  CLOSED.  DENIES HSV AND MRSA.   GBS- NEG   SAYS LAST DRUG- HEROIN  AND  MARIJUANA  USE    HAS  BEEN  MTHS     AND  NOT  TAKING  METHADONE -  SO SHE  CAN GET PAIN MEDS.

## 2016-10-13 NOTE — Anesthesia Procedure Notes (Signed)
Epidural Patient location during procedure: OB Start time: 10/13/2016 7:23 AM  Staffing Anesthesiologist: Mal AmabileFOSTER, Seymour Pavlak Performed: anesthesiologist   Preanesthetic Checklist Completed: patient identified, site marked, surgical consent, pre-op evaluation, timeout performed, IV checked, risks and benefits discussed and monitors and equipment checked  Epidural Patient position: sitting Prep: site prepped and draped and DuraPrep Patient monitoring: continuous pulse ox and blood pressure Approach: midline Location: L3-L4 Injection technique: LOR air  Needle:  Needle type: Tuohy  Needle gauge: 17 G Needle length: 9 cm and 9 Needle insertion depth: 5 cm cm Catheter type: closed end flexible Catheter size: 19 Gauge Catheter at skin depth: 10 cm Test dose: negative and Other  Assessment Events: blood not aspirated, injection not painful, no injection resistance, negative IV test and no paresthesia  Additional Notes Patient identified. Risks and benefits discussed including failed block, incomplete  Pain control, post dural puncture headache, nerve damage, paralysis, blood pressure Changes, nausea, vomiting, reactions to medications-both toxic and allergic and post Partum back pain. All questions were answered. Patient expressed understanding and wished to proceed. Sterile technique was used throughout procedure. Epidural site was Dressed with sterile barrier dressing. No paresthesias, signs of intravascular injection Or signs of intrathecal spread were encountered.  Patient was more comfortable after the epidural was dosed. Please see RN's note for documentation of vital signs and FHR which are stable.

## 2016-10-13 NOTE — Anesthesia Preprocedure Evaluation (Signed)
Anesthesia Evaluation  Patient identified by MRN, date of birth, ID band Patient awake    Reviewed: Allergy & Precautions, Patient's Chart, lab work & pertinent test results  Airway Mallampati: III       Dental no notable dental hx. (+) Teeth Intact   Pulmonary Current Smoker,    Pulmonary exam normal breath sounds clear to auscultation       Cardiovascular negative cardio ROS Normal cardiovascular exam Rhythm:Regular Rate:Normal     Neuro/Psych negative neurological ROS  negative psych ROS   GI/Hepatic GERD  Medicated and Controlled,(+)     substance abuse  alcohol use, cocaine use, marijuana use, methamphetamine use and IV drug use, Hepatitis -, CHx/o Polysubstance abuse- on methadone maintenance   Endo/Other  negative endocrine ROS  Renal/GU negative Renal ROS  negative genitourinary   Musculoskeletal negative musculoskeletal ROS (+)   Abdominal (+) + obese,   Peds  Hematology negative hematology ROS (+)   Anesthesia Other Findings   Reproductive/Obstetrics (+) Pregnancy                             Anesthesia Physical Anesthesia Plan  ASA: II  Anesthesia Plan: Epidural   Post-op Pain Management:    Induction:   Airway Management Planned: Natural Airway  Additional Equipment:   Intra-op Plan:   Post-operative Plan:   Informed Consent: I have reviewed the patients History and Physical, chart, labs and discussed the procedure including the risks, benefits and alternatives for the proposed anesthesia with the patient or authorized representative who has indicated his/her understanding and acceptance.     Plan Discussed with: Anesthesiologist  Anesthesia Plan Comments:         Anesthesia Quick Evaluation

## 2016-10-14 MED ORDER — PANTOPRAZOLE SODIUM 40 MG PO TBEC
40.0000 mg | DELAYED_RELEASE_TABLET | Freq: Every day | ORAL | Status: DC
  Administered 2016-10-14 – 2016-10-15 (×3): 40 mg via ORAL
  Filled 2016-10-14 (×3): qty 1

## 2016-10-14 MED ORDER — PANTOPRAZOLE SODIUM 40 MG PO TBEC: 40.0000 mg | DELAYED_RELEASE_TABLET | Freq: Every day | ORAL | Status: DC

## 2016-10-14 NOTE — Anesthesia Postprocedure Evaluation (Signed)
Anesthesia Post Note  Patient: Nicole Moss  Procedure(s) Performed: * No procedures listed *  Patient location during evaluation: Mother Baby Anesthesia Type: Epidural Level of consciousness: awake and alert Pain management: pain level controlled Vital Signs Assessment: post-procedure vital signs reviewed and stable Respiratory status: spontaneous breathing, nonlabored ventilation and respiratory function stable Cardiovascular status: stable Postop Assessment: no headache, no backache and epidural receding Anesthetic complications: no        Last Vitals:  Vitals:   10/14/16 0015 10/14/16 0730  BP: 121/69 123/83  Pulse: 73 70  Resp: 18 18  Temp: 36.7 C 36.9 C    Last Pain:  Vitals:   10/14/16 0730  TempSrc: Oral  PainSc:    Pain Goal: Patients Stated Pain Goal: 4 (10/13/16 0640)               Marrion CoyMERRITT,[name]

## 2016-10-14 NOTE — Lactation Note (Signed)
This note was copied from a baby's chart. Lactation Consultation Note  Patient Name: Nicole [name] UJWJX'BToday's Date: 10/14/2016   Visited with MOB today, baby 6122 hrs old.  Mom has decided to formula feed due to her Methadone regime.  Talked about the controlled Methadone (L2 category) dosing and breastfeeding being compatible. She stated she just felt better about formula feeding as she also smokes cigarettes.  Talked about my respecting her decision, and discussed engorgement treatment when her milk volume comes in.   Mom was told that if she had any questions or changed her mind, to ask her nurse to assist her, and Lactation would be there to assist her.   Judee ClaraSmith, Bayle Calvo E 10/14/2016, 2:48 PM

## 2016-10-14 NOTE — Progress Notes (Signed)
Post Partum Day 1 Subjective: no complaints, up ad lib, voiding and tolerating PO. Still reports mild nausea but no vomiting. Ate [employer] last night with no issues.   Objective: Blood pressure 121/69, pulse 73, temperature 98.1 F (36.7 C), temperature source Oral, resp. rate 18, height 5\' 7"  (1.702 m), weight 62.1 kg (137 lb), last menstrual period 02/01/2016, SpO2 99 %, unknown if currently breastfeeding.  Physical Exam:  General: alert, cooperative and no distress Lochia: appropriate Uterine Fundus: firm DVT Evaluation: No evidence of DVT seen on physical exam. No cords or calf tenderness. No significant calf/ankle edema.   Recent Labs  10/13/16 0620  HGB 13.1  HCT 38.1    Assessment/Plan: Plan for discharge tomorrow. Contraception IUD. Still needs to see Child psychotherapistocial Worker.    LOS: 1 day   Nicole Moss 10/14/2016, 7:22 AM   CNM attestation Post Partum Day #1  Nicole MulletKatlyn Moss is a 26 y.o. G1P1001 s/p SVD.  Pt denies problems with ambulating, voiding or po intake. Pain is well controlled.  Plan for birth control is IUD.  Method of Feeding: bottle  PE:  BP 115/63   Pulse 81   Temp 99 F (37.2 C)   Resp 18   Ht 5\' 7"  (1.702 m)   Wt 62.1 kg (137 lb)   LMP 02/01/2016   SpO2 100%   Breastfeeding? Unknown   BMI 21.46 kg/m  Fundus firm  Plan for discharge: 10/15/16  [name], Nicole Moss, CNM 6:27 PM

## 2016-10-15 ENCOUNTER — Encounter: Payer: Self-pay | Admitting: Obstetrics & Gynecology

## 2016-10-15 ENCOUNTER — Other Ambulatory Visit: Payer: Self-pay

## 2016-10-15 MED ORDER — MEASLES, MUMPS & RUBELLA VAC ~~LOC~~ INJ
0.5000 mL | INJECTION | Freq: Once | SUBCUTANEOUS | Status: AC
  Administered 2016-10-15: 0.5 mL via SUBCUTANEOUS
  Filled 2016-10-15: qty 0.5

## 2016-10-15 MED ORDER — IBUPROFEN 600 MG PO TABS: 600.0000 mg | ORAL_TABLET | Freq: Four times a day (QID) | ORAL | 0 refills | Status: DC | PRN

## 2016-10-15 NOTE — Plan of Care (Signed)
Problem: Coping: Goal: Ability to cope will improve Outcome: Completed/Met Date Met: 10/15/16 Patient appears to be coping well with baby's transfer to NICU and is appropriate when discussing baby.  Problem: Role Relationship: Goal: Ability to demonstrate positive interaction with newborn will improve Outcome: Completed/Met Date Met: 10/15/16 Patient shows appropriate concern and care for baby in NICU.

## 2016-10-15 NOTE — Discharge Summary (Signed)
  OB Discharge Summary     Patient Name: Nicole Moss DOB: 01/30/1991 MRN: [id number]  Date of admission: 10/13/2016 Delivering MD: HOGAN, HEATHER D   Date of discharge: 10/15/2016  Admitting diagnosis: 40 WEEKS CTX N V  Intrauterine pregnancy: [redacted]w[redacted]d     Secondary diagnosis:  Active Problems:   Normal labor  Additional problems: hx polysubstance abuse, currently on methadone; chronic Hep C; rubella nonimmune     Discharge diagnosis: Term Pregnancy Delivered                                                                                                Post partum procedures:none  Augmentation: AROM  Complications: None  Hospital course:  Onset of Labor With Vaginal Delivery     25 y.o. yo G1P1001 at [redacted]w[redacted]d was admitted in Active Labor on 10/13/2016. Patient had an uncomplicated labor course as follows:  Membrane Rupture Time/Date: 1:15 PM ,10/13/2016   Intrapartum Procedures: Episiotomy: None [1]                                         Lacerations:  1st degree [2];Labial [10]  Patient had a delivery of a Viable infant. 10/13/2016  Information for the patient's newborn:  Tippets, Girl Deatra [[id number]]       Pateint had an uncomplicated postpartum course.  She is ambulating, tolerating a regular diet, passing flatus, and urinating well. Patient is discharged home in stable condition on 10/15/16. MMR offered at discharge.   Physical exam  Vitals:   10/14/16 0015 10/14/16 0730 10/14/16 1952 10/15/16 0552  BP: 121/69 123/83 118/62 115/63  Pulse: 73 70 92 81  Resp: 18 18 18 18  Temp: 98.1 F (36.7 C) 98.4 F (36.9 C) 98.5 F (36.9 C) 99 F (37.2 C)  TempSrc: Oral Oral Oral   SpO2:  100%    Weight:      Height:       General: alert and cooperative Lochia: appropriate Uterine Fundus: firm Incision: N/A DVT Evaluation: No evidence of DVT seen on physical exam. Labs: Lab Results  Component Value Date   WBC 11.8 (H) 10/13/2016   HGB 13.1 10/13/2016   HCT 38.1  10/13/2016   MCV 93.6 10/13/2016   PLT 216 10/13/2016   CMP Latest Ref Rng & Units 07/22/2016  Glucose 65 - 99 mg/dL 102(H)  BUN 7 - 25 mg/dL 4(L)  Creatinine 0.50 - 1.10 mg/dL 0.55  Sodium 135 - 146 mmol/L 137  Potassium 3.5 - 5.3 mmol/L 4.1  Chloride 98 - 110 mmol/L 106  CO2 20 - 31 mmol/L 20  Calcium 8.6 - 10.2 mg/dL 8.6  Total Protein 6.1 - 8.1 g/dL 5.8(L)  Total Bilirubin 0.2 - 1.2 mg/dL 0.2  Alkaline Phos 33 - 115 U/L 66  AST 10 - 30 U/L 21  ALT 6 - 29 U/L 17    Discharge instruction: per After Visit Summary and "Baby and Me Booklet".  After visit meds:  Allergies as of 10/15/2016        Reactions   Penicillins Nausea And Vomiting   Has patient had a PCN reaction causing immediate rash, facial/tongue/throat swelling, SOB or lightheadedness with hypotension: No Has patient had a PCN reaction causing severe rash involving mucus membranes or skin necrosis: No Has patient had a PCN reaction that required hospitalization No Has patient had a PCN reaction occurring within the last 10 years: Yes If all of the above answers are "NO", then may proceed with Cephalosporin use.   Sulfa Antibiotics Nausea And Vomiting      Medication List    STOP taking these medications   bismuth subsalicylate 782 UM/35TI suspension Commonly known as:  PEPTO BISMOL   calcium carbonate 500 MG chewable tablet Commonly known as:  TUMS - dosed in mg elemental calcium   cephALEXin 250 MG capsule Commonly known as:  KEFLEX   metroNIDAZOLE 500 MG tablet Commonly known as:  FLAGYL   ondansetron 4 MG disintegrating tablet Commonly known as:  ZOFRAN ODT     TAKE these medications   CONCEPT OB 130-92.4-1 MG Caps Take 1 capsule by mouth daily.   ibuprofen 600 MG tablet Commonly known as:  ADVIL,MOTRIN Take 1 tablet (600 mg total) by mouth every 6 (six) hours as needed.   methadone 10 MG/ML solution Commonly known as:  DOLOPHINE Take 120 mg by mouth daily.       Diet: routine  diet  Activity: Advance as tolerated. Pelvic rest for 6 weeks.   Outpatient follow up:6 weeks Follow up Appt:Future Appointments Date Time Provider [place]  10/19/2016 7:30 AM [facility]-BSSCHED ROOM [facility]-BSSCHED None   Follow up Visit:No Follow-up on file.  Postpartum contraception: IUD Mirena  Newborn Data: Live born female  Birth Weight: 6 lb 6.7 oz (2910 g) APGAR: 7, 9  Baby Feeding: Bottle Disposition:NICU for NAS   10/15/2016 Serita Grammes, CNM  10:47 AM

## 2016-10-15 NOTE — Progress Notes (Signed)
CSW received a telephone call from [place] CPS informing CSW that MOB's case has been assigned to CPS worker, Shenika Bookman ([phone]).  CPS will follow-up with CSW prior to infant's d/c.   At this time there are barriers to d/c.  Nicole Moss, MSW, LCSW Clinical Social Work [phone]  

## 2016-10-15 NOTE — Progress Notes (Signed)
This note is for yesterday, 10-15-15. I saw the patient after the student and agree with his assessment and plan.

## 2016-10-15 NOTE — Clinical Social Work Maternal (Signed)
CLINICAL SOCIAL WORK MATERNAL/CHILD NOTE  Patient Details  Name: Nicole Moss MRN: [id number] Date of Birth: 11/30/1990  Date:  09-12-2016  Clinical Social Worker Initiating Note:  Laurey Arrow Date/ Time Initiated:  10/14/16/1500     Child's Name:  Nicole Moss   Legal Guardian:  Mother (FOB is Nicole Moss 07/31/1989)   Need for Interpreter:  None   Date of Referral:  April 20, 2017     Reason for Referral:  Current Substance Use/Substance Use During Pregnancy    Referral Source:  Central Nursery   Address:  [place] [address]  Phone number:  [phone]   Household Members:  Self, Significant Other   Natural Supports (not living in the home):  Parent (FOB's parents will be a source of support. )   Professional Supports: Engineer, civil (consulting) [employer] )   Employment: Unemployed   Type of Work:     Education:  Database administrator Resources:  Medicaid   Other Resources:  Physicist, medical , [place] Considerations Which May Impact Care:  Per [employer], MOB is Engineer, manufacturing.   Strengths:  Ability to meet basic needs , Pediatrician chosen , Home prepared for child    Risk Factors/Current Problems:  Substance Use    Cognitive State:  Alert , Able to Concentrate , Linear Thinking    Mood/Affect:  Bright , Interested , Comfortable , Happy    CSW Assessment: CSW met with MOB to complete an assessment for hx of SA and positive UDS for THC.  When CSW arrived MOB was attaching and bonding with infant as evident by engaging in skin to skin.  MOB was polite, engaging, and interested in meeting with CSW.  MOB immediately informed CSW that she remembers CSW from [facility] and thanked CSW for connecting MOB with [employer].  MOB reported that MOB has been consistent with receiving methadone treatment and has established a therapeutic relationship with MOB's counselor.  CSW inquired about MOB's supports and MOB  reported being supported by FOB, FOB's family, and MOB's immediate and extended family members (MOB's Mother: Nicole Moss [phone], FOB's Parent's: Mortimer Fries and Keidra Withers [phone]). MOB also reported that MOB has been supported by [facility] Manager Morey Hummingbird).    CSW informed MOB of hospital's SA policy. MOB denied the use of heroin and acknowledged the use of marijuana. MOB disclosed MOB's last use of heroin was 06/2016, cocaine 04/2016, and marijuana 09/2016. CSW informed MOB of the 2 screenings for the infant. MOB appeared understanding and communicated she is nervous about infant's drug screenings. CSW shared with MOB that the infant's UDS was positive for THC.  CSW made MOB aware that CSW will make a report to [place]. CSW will also monitor the infant's CDS and update CPS of the results. CSW thanked MOB for being honest and explained to MOB the process of a CPS investigation.  MOB did not have any questions.    CSW educated MOB about PPD. CSW informed MOB of possible supports and interventions to decrease PPD.  CSW also encouraged MOB to seek medical attention if needed for increased signs and symptoms for PPD. CSW reviewed safe sleep, and SIDS. MOB was knowledgeable and asked appropriate questions.  MOB communicated that she has a pack n play for the baby, and feels prepared for the infant.  MOB did not have any further questions, concerns, or needs at this time. CSW thanked MOB for meeting with  CSW.   CPS report made to CPS worker, Pam Miller.  There are no barriers to d/c and CPS will follow-up with family.     CSW Plan/Description:  Information/Referral to Community Resources , Patient/Family Education , Child Protective Service Report , No Further Intervention Required/No Barriers to Discharge   Zuriel Roskos Boyd-Gilyard, MSW, LCSW Clinical Social Work [phone]   Suheily Birks D BOYD-GILYARD, LCSW 10/15/2016, 9:15 AM 

## 2016-10-15 NOTE — Discharge Instructions (Signed)

## 2016-10-19 ENCOUNTER — Inpatient Hospital Stay (HOSPITAL_COMMUNITY): Admission: RE | Admit: 2016-10-19 | Payer: Medicaid Other | Source: Ambulatory Visit | Admitting: Family Medicine

## 2016-11-24 ENCOUNTER — Ambulatory Visit (INDEPENDENT_AMBULATORY_CARE_PROVIDER_SITE_OTHER): Payer: Medicaid Other | Admitting: Obstetrics & Gynecology

## 2016-11-24 ENCOUNTER — Encounter: Payer: Self-pay | Admitting: Obstetrics & Gynecology

## 2016-11-24 DIAGNOSIS — Z30430 Encounter for insertion of intrauterine contraceptive device: Secondary | ICD-10-CM | POA: Diagnosis not present

## 2016-11-24 MED ORDER — LEVONORGESTREL 20 MCG/24HR IU IUD
INTRAUTERINE_SYSTEM | Freq: Once | INTRAUTERINE | Status: AC
  Administered 2016-11-24: 13:00:00 via INTRAUTERINE

## 2016-11-24 NOTE — Patient Instructions (Signed)

## 2016-11-24 NOTE — Progress Notes (Signed)
Post Partum Exam  Nicole Moss is a 26 y.o. G69P1001 female who presents for a postpJnai [name]. She is 6 weeks postpartum following a spontaneous vaginal delivery. I have fully reviewed the prenatal and intrapartum course. The delivery was at [redacted]w[redacted]d gestational weeks.  Anesthesia: epidural. Postpartum course has been unremarkable. Baby's course has been unremarkable. Baby is feeding by bottle - Similac Total Comfort. Bleeding no bleeding. Bowel function is normal. Bladder function is normal. Patient is not sexually active. Contraception method is IUD. Postpartum depression screening: Negative-Score=1  The following portions of the patient's history were reviewed and updated as appropriate: allergies, current medications, past family history, past medical history, past social history, past surgical history and problem list.  Normal pap 06/2016  Review of Systems Pertinent items noted in HPI and remainder of comprehensive ROS otherwise negative.    Objective:  unknown if currently breastfeeding.  General:  alert and no distress   Breasts:  inspection negative, no nipple discharge or bleeding, no masses or nodularity palpable  Lungs: clear to auscultation bilaterally  Heart:  regular rate and rhythm  Abdomen: soft, non-tender; bowel sounds normal; no masses,  no organomegaly   Vulva:  normal  Vagina: normal vagina, no discharge, exudate, lesion, or erythema  Cervix:  no cervical motion tenderness and no lesions  Corpus: normal size, contour, position, consistency, mobility, non-tender  Adnexa:  no mass, fullness, tenderness  Rectal Exam: Not performed.      IUD Insertion Procedure Note Patient identified, informed consent performed, consent signed.   Discussed risks of irregular bleeding, cramping, infection, malpositioning or misplacement of the IUD outside the uterus which may require further procedure such as laparoscopy. Time out was performed.  Urine pregnancy test negative.  Speculum  placed in the vagina.  Cervix visualized.  Cleaned with Betadine x 2.  Grasped anteriorly with a single tooth tenaculum.  Uterus sounded to 8 cm.  Mirena IUD placed per manufacturer's recommendations.  Strings trimmed to 3 cm. Tenaculum was removed, good hemostasis noted.  Patient tolerated procedure well.   Patient was given post-procedure instructions.  She was advised to have backup contraception for one week.  Patient was also asked to check IUD strings periodically and follow up in 4 weeks for IUD check.  Assessment:   Normal postpartum exam. Pap smear not done at today's visit.  Mirena IUD placement.  Plan:   1. Contraception: IUD 2. No other issues 3. Follow up in: 4 weeks for IUD check or as needed.   Nicole Collins, MD, FACOG Attending Obstetrician & Gynecologist, [facility] for [employer], [facility]

## 2016-11-24 NOTE — Progress Notes (Signed)
Tried to placed [place] IUD but had some difficulty with the applicator x 3 times. Mirena placed instead without problem.   Tereso Newcomer, MD

## 2016-12-22 ENCOUNTER — Ambulatory Visit: Payer: Self-pay | Admitting: Obstetrics & Gynecology

## 2018-08-02 IMAGING — US US MFM OB COMP +14 WKS
1 series · 14 of 28 positions shown · non-contrast
Comparison: none

[Series 1: us mfm ob comp +14 wks · 55 acquisitions, 14 frames shown]
[im 3/55]
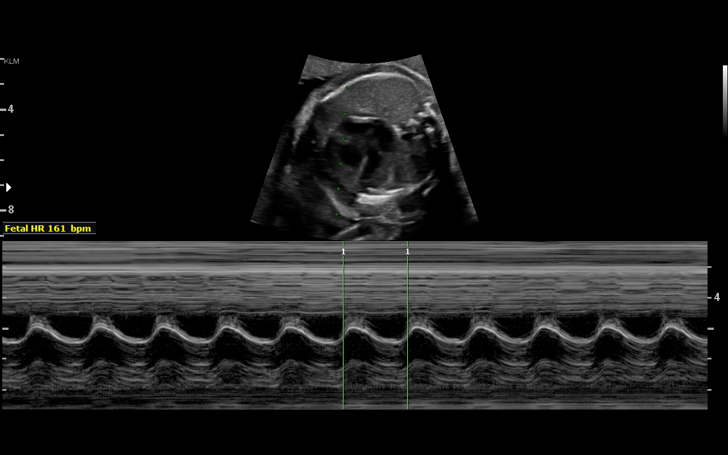
[im 7/55]
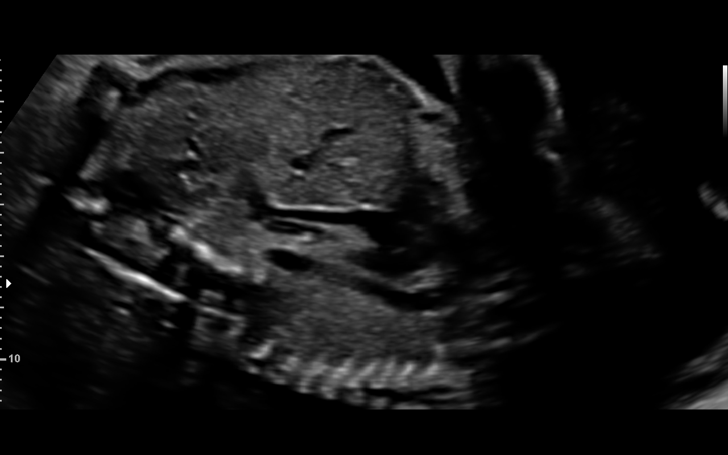
[im 11/55]
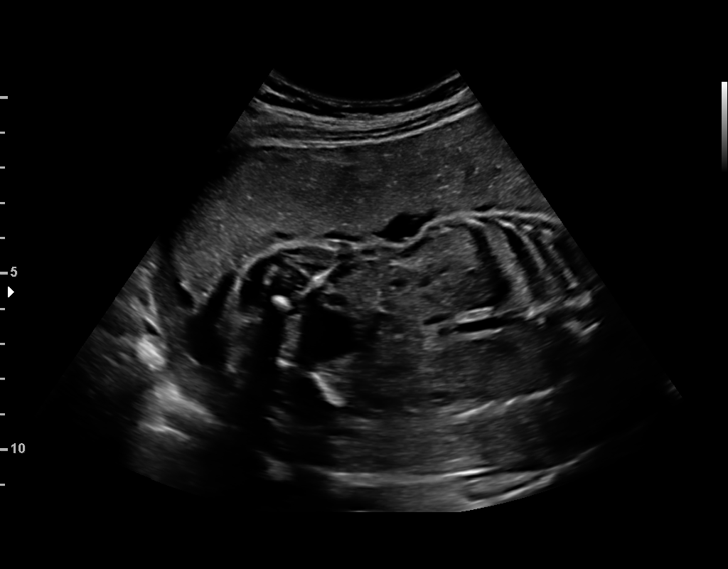
[im 15/55]
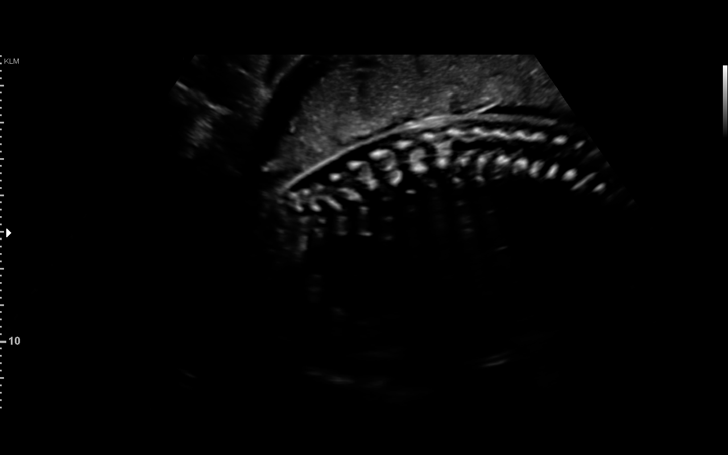
[im 19/55]
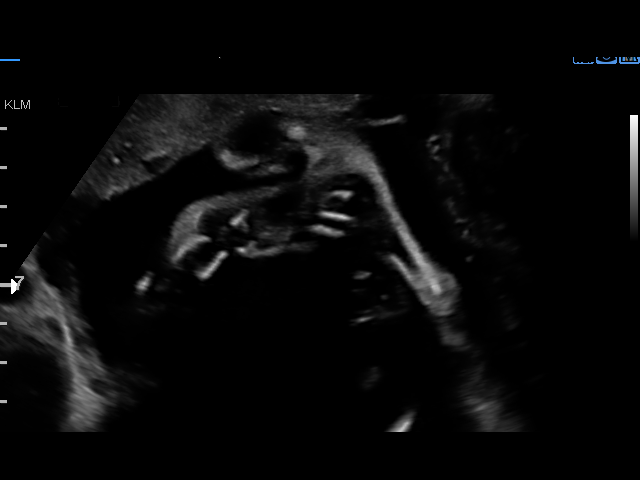
[im 23/55]
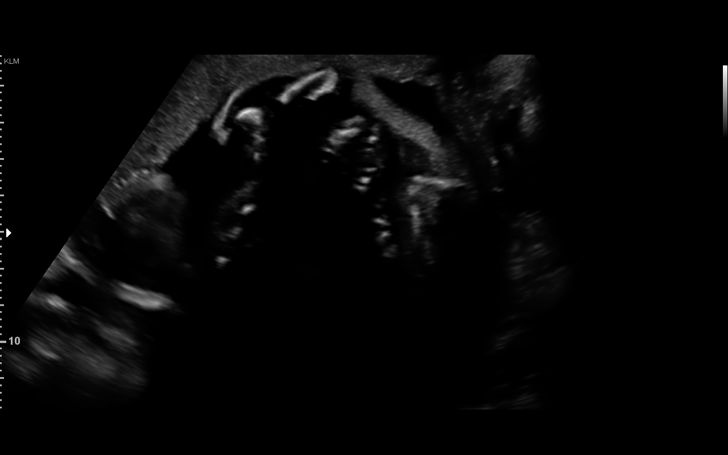
[im 27/55]
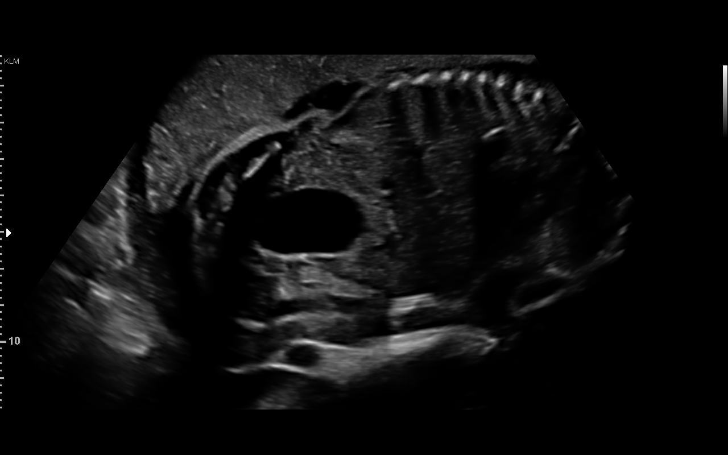
[im 31/55]
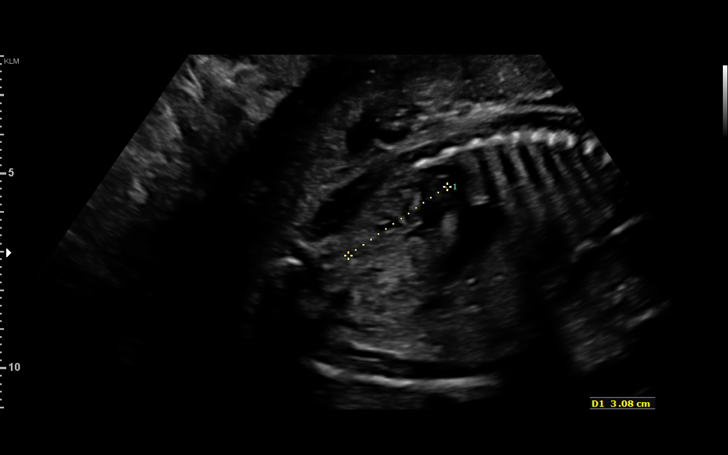
[im 35/55]
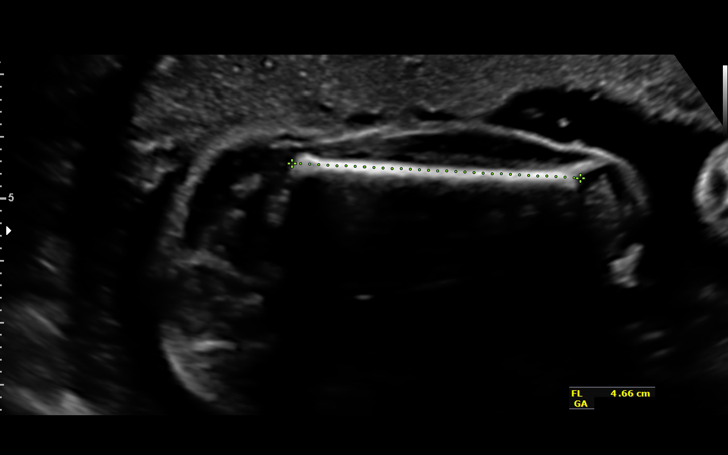
[im 39/55]
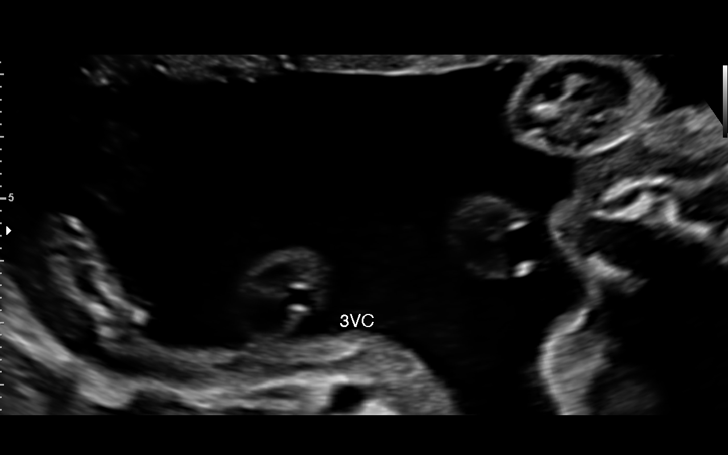
[im 43/55]
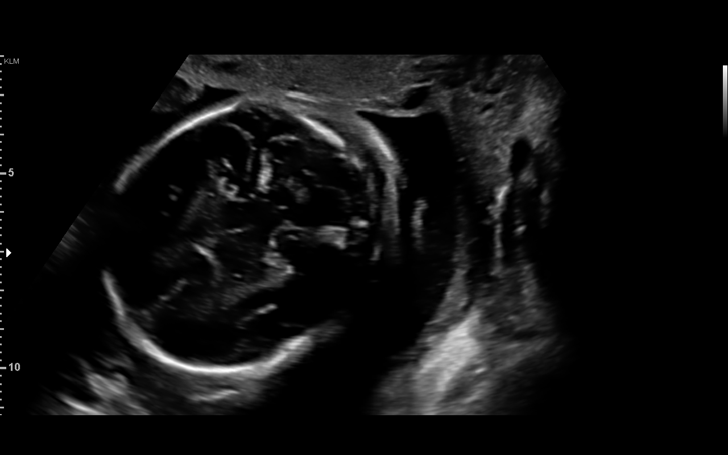
[im 47/55]
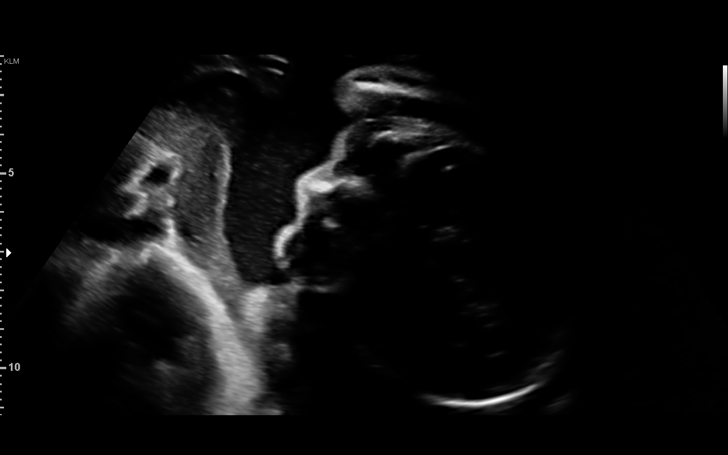
[im 51/55]
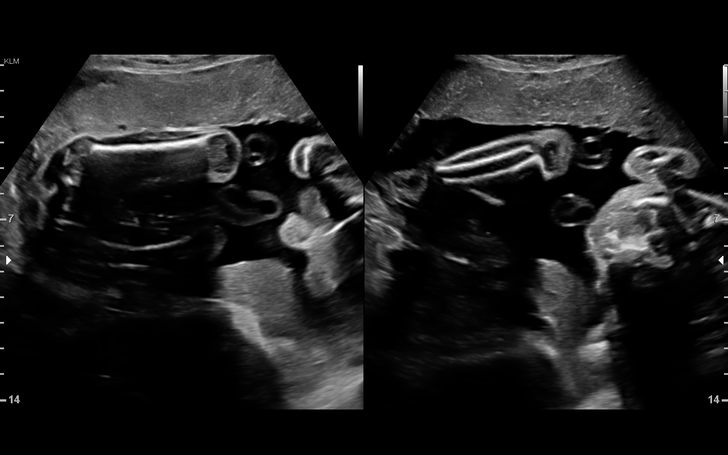
[im 55/55]
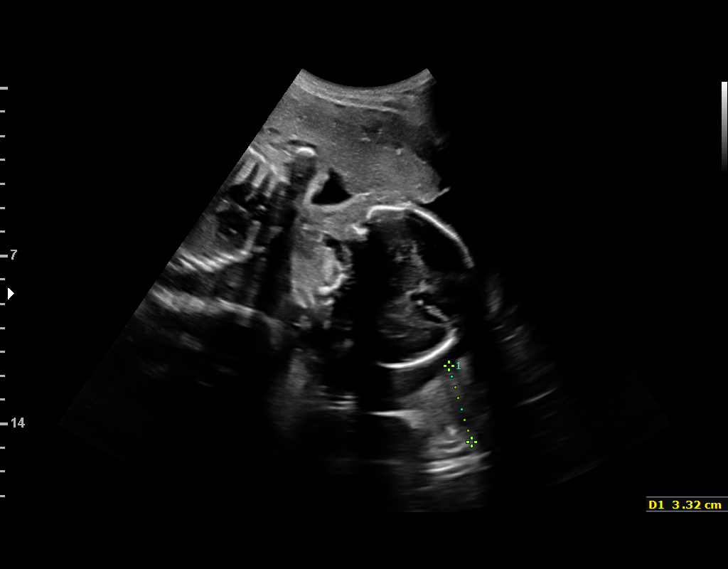

[14 of 28 positions shown; findings below may reference images not displayed]

[REDACTED]

1  ERNZER STERGES           [id number]      [id number]     [id number]
Indications

25 weeks gestation of pregnancy
Drug dependence complicating pregnancy,
antepartum condition or complication ([DATE]
+opiates, cocaine, THC)
Encounter for antenatal screening for
malformations
Chronic Hepatitis C complicating pregnancy,
antepartum
OB History

Gravidity:    1         Term:   0        Prem:   0        SAB:   0
TOP:          0       Ectopic:  0        Living: 0
Fetal Evaluation

Num Of Fetuses:     1
Fetal Heart         161
Rate(bpm):
Cardiac Activity:   Observed
Presentation:       Cephalic
Placenta:           Anterior, above cervical os
P. Cord Insertion:  Visualized

Amniotic Fluid
AFI FV:      Subjectively within normal limits
Largest Pocket(cm)
4.5
Biometry

BPD:        65  mm     G. Age:  26w 2d         75  %    CI:        75.56   %   70 - 86
FL/HC:      19.6   %   18.7 -
HC:      237.1  mm     G. Age:  25w 6d         45  %    HC/AC:      1.06       1.04 -
AC:      223.7  mm     G. Age:  26w 5d         83  %    FL/BPD:     71.4   %   71 - 87
FL:       46.4  mm     G. Age:  25w 3d         41  %    FL/AC:      20.7   %   20 - 24
HUM:      42.8  mm     G. Age:  25w 4d         53  %
Est. FW:     902  gm          2 lb      71  %
Gestational Age

U/S Today:     26w 1d                                        EDD:   10/06/16
Best:          25w 2d    Det. By:   Early Ultrasound         EDD:   10/12/16
(02/20/16)
Anatomy

Cranium:               Appears normal         Aortic Arch:            Not well visualized
Cavum:                 Not well visualized    Ductal Arch:            Not well visualized
Ventricles:            Appears normal         Diaphragm:              Appears normal
Choroid Plexus:        Appears normal         Stomach:                Appears normal, left
sided
Cerebellum:            Appears normal         Abdomen:                Appears normal
Posterior Fossa:       Appears normal         Abdominal Wall:         Not well visualized
Nuchal Fold:           Not applicable (>20    Cord Vessels:           Appears normal (3
wks GA)                                        vessel cord)
Face:                  Orbits nl; profile not Kidneys:                Appear normal
well visualized
Lips:                  Appears normal         Bladder:                Appears normal
Thoracic:              Appears normal         Spine:                  Appears normal
Heart:                 Appears normal         Upper Extremities:      Not well visualized
(4CH, axis, and situs
RVOT:                  Not well visualized    Lower Extremities:      Visualized
LVOT:                  Appears normal

Other:  Fetus appears to be a female. Heels visualized.
Cervix Uterus Adnexa

Cervix
Length:            3.3  cm.
Normal appearance by transabdominal scan.
Impression

Single IUP at 25w 2d
Limited views of the fetal heart, cord insertion and upper
extremities obtained due to fetal position
The remainder of the fetal anatomy appears normal
The estimated fetal weight is at the 71st %tile
Anterior placenta without previa
Normal amniotic fluid volume
Recommendations

Recommend follow-up ultrasound examination in 4 weeks for
interval growth and to reevaluate fetal anatomy

## 2018-08-31 ENCOUNTER — Ambulatory Visit: Payer: Self-pay | Admitting: Family

## 2018-08-31 ENCOUNTER — Ambulatory Visit (INDEPENDENT_AMBULATORY_CARE_PROVIDER_SITE_OTHER): Payer: Medicaid Other | Admitting: Family

## 2018-08-31 ENCOUNTER — Encounter: Payer: Self-pay | Admitting: Family

## 2018-08-31 VITALS — BP 108/69 | HR 80 | Temp 98.4°F | Ht 67.0 in | Wt 112.5 lb

## 2018-08-31 DIAGNOSIS — B182 Chronic viral hepatitis C: Secondary | ICD-10-CM | POA: Diagnosis not present

## 2018-08-31 DIAGNOSIS — F1190 Opioid use, unspecified, uncomplicated: Secondary | ICD-10-CM | POA: Insufficient documentation

## 2018-08-31 DIAGNOSIS — F1199 Opioid use, unspecified with unspecified opioid-induced disorder: Secondary | ICD-10-CM

## 2018-08-31 NOTE — Patient Instructions (Signed)
Nice to meet you.  Limit acetaminophen (Tylenol) usage to no more than 2 grams (2,000 mg) per day.  Avoid alcohol.  Do not share toothbrushes or razors.  Practice safe sex to protect against transmission as well as sexually transmitted disease.    Hepatitis C Hepatitis C is a viral infection of the liver. It can lead to scarring of the liver (cirrhosis), liver failure, or liver cancer. Hepatitis C may go undetected for months or years because people with the infection may not have symptoms, or they may have only mild symptoms. What are the causes? This condition is caused by the hepatitis C virus (HCV). The virus can spread from person to person (is contagious) through:  Blood.  Childbirth. A woman who has hepatitis C can pass it to her baby during birth.  Bodily fluids, such as breast milk, tears, semen, vaginal fluids, and saliva.  Blood transfusions or organ transplants done in the [place] before 1992.  What increases the risk? The following factors may make you more likely to develop this condition:  Having contact with unclean (contaminated) needles or syringes. This may result from: ? Acupuncture. ? Tattoing. ? Body piercing. ? Injecting drugs.  Having unprotected sex with someone who is infected.  Needing treatment to filter your blood (kidney dialysis).  Having HIV (human immunodeficiency virus) or AIDS (acquired immunodeficiency syndrome).  Working in a job that involves contact with blood or bodily fluids, such as health care.  What are the signs or symptoms? Symptoms of this condition include:  Fatigue.  Loss of appetite.  Nausea.  Vomiting.  Abdominal pain.  Dark yellow urine.  Yellowish skin and eyes (jaundice).  Itchy skin.  Clay-colored bowel movements.  Joint pain.  Bleeding and bruising easily.  Fluid building up in your stomach (ascites).  In some cases, you may not have any symptoms. How is this diagnosed? This condition  is diagnosed with:  Blood tests.  Other tests to check how well your liver is functioning. They may include: ? Magnetic resonance elastography (MRE). This imaging test uses MRIs and sound waves to measure liver stiffness. ? Transient elastography. This imaging test uses ultrasounds to measure liver stiffness. ? Liver biopsy. This test requires taking a small tissue sample from your liver to examine it under a microscope.  How is this treated? Your health care provider may perform noninvasive tests or a liver biopsy to help decide the best course of treatment. Treatment may include:  Antiviral medicines and other medicines.  Follow-up treatments every 6-12 months for infections or other liver conditions.  Receiving a donated liver (liver transplant).  Follow these instructions at home: Medicines  Take over-the-counter and prescription medicines only as told by your health care provider.  Take your antiviral medicine as told by your health care provider. Do not stop taking the antiviral even if you start to feel better.  Do not take any medicines unless approved by your health care provider, including over-the-counter medicines and birth control pills. Activity  Rest as needed.  Do not have sex unless approved by your health care provider.  Ask your health care provider when you may return to school or work. Eating and drinking  Eat a balanced diet with plenty of fruits and vegetables, whole grains, and lowfat (lean) meats or non-meat proteins (such as beans or tofu).  Drink enough fluids to keep your urine clear or pale yellow.  Do not drink alcohol. General instructions  Do not share toothbrushes, nail clippers, or   razors.  Wash your hands frequently with soap and water. If soap and water are not available, use hand sanitizer.  Cover any cuts or open sores on your skin to prevent spreading the virus.  Keep all follow-up visits as told by your health care provider. This  is important. You may need follow-up visits every 6-12 months. How is this prevented? There is no vaccine for hepatitis C. The only way to prevent the disease is to reduce the risk of exposure to the virus. Make sure you:  Wash your hands frequently with soap and water. If soap and water are not available, use hand sanitizer.  Do not share needles or syringes.  Practice safe sex and use condoms.  Avoid handling blood or bodily fluids without gloves or other protection.  Avoid getting tattoos or piercings in shops or other locations that are not clean.  Contact a health care provider if:  You have a fever.  You develop abdominal pain.  You pass dark urine.  You pass clay-colored stools.  You develop joint pain. Get help right away if:  You have increasing fatigue or weakness.  You lose your appetite.  You cannot eat or drink without vomiting.  You develop jaundice or your jaundice gets worse.  You bruise or bleed easily. Summary  Hepatitis C is a viral infection of the liver. It can lead to scarring of the liver (cirrhosis), liver failure, or liver cancer.  The hepatitis C virus (HCV) causes this condition. The virus can pass from person to person (is contagious).  You should not take any medicines unless approved by your health care provider. This includes over-the-counter medicines and birth control pills. This information is not intended to replace advice given to you by your health care provider. Make sure you discuss any questions you have with your health care provider. Document Released: 07/16/2000 Document Revised: 08/24/2016 Document Reviewed: 08/24/2016 Elsevier Interactive Patient Education  2018 Elsevier Inc.   

## 2018-08-31 NOTE — Assessment & Plan Note (Signed)
Stable with counseling and continued use of methadone. Sober from IV drugs for 2-3 years.

## 2018-08-31 NOTE — Progress Notes (Signed)
Subjective:    Patient ID: Nicole MulletKatlyn Baiz, female    DOB: 06/26/1991, 28 y.o.   MRN: [id number]  Chief Complaint  Patient presents with  . Hepatitis C    HPI:  Nicole Moss is a 28 y.o. female who presents today for initial office visit for evaluation and treatment of Hepatitis C.   Ms. Cory RoughenKirkman has a previous medical history significant for IV drug use who was initially diagnosed with Hepatitis C in 2017 while she was incarcerated. Risk factors for acquiring Hepatitis C include IV drug use, tattoos and sexual encounter with a positive partner. She has not shared razors/toothbruses nor received blood transfusions. She is treatment naive. No personal history of liver disease. Mother had cirrhosis, however she had history of alcohol abuse. Currently asymptomatic. Blood work completed in her PCP's office shows a positive Hepatitis C antibody with Genotype 1a and viral load of 65,300 on 06/19/18. Her liver function with AST 31, ALT 38 and platelets were 188. She is currently receiving counseling through [facility] and is receiving methadone for her opioid use disorder. She has been sober close to 3 years now. She does not have Hepatitis B infection with immunity unknown.     Allergies  Allergen Reactions  . Penicillins Nausea And Vomiting    Has patient had a PCN reaction causing immediate rash, facial/tongue/throat swelling, SOB or lightheadedness with hypotension: No Has patient had a PCN reaction causing severe rash involving mucus membranes or skin necrosis: No Has patient had a PCN reaction that required hospitalization No Has patient had a PCN reaction occurring within the last 10 years: Yes If all of the above answers are "NO", then may proceed with Cephalosporin use.   . Sulfa Antibiotics Nausea And Vomiting      Outpatient Medications Prior to Visit  Medication Sig Dispense Refill  . methadone (DOLOPHINE) 10 MG/ML solution Take 120 mg by mouth daily.    Marland Kitchen. ibuprofen  (ADVIL,MOTRIN) 600 MG tablet Take 1 tablet (600 mg total) by mouth every 6 (six) hours as needed. (Patient not taking: Reported on 08/31/2018) 30 tablet 0  . Prenat w/o A Vit-FeFum-FePo-FA (CONCEPT OB) 130-92.4-1 MG CAPS Take 1 capsule by mouth daily. (Patient not taking: Reported on 08/31/2018) 30 capsule 11   No facility-administered medications prior to visit.      Past Medical History:  Diagnosis Date  . Addiction to drug (HCC)   . Anorexia   . Chronic hepatitis C complicating pregnancy, antepartum (HCC) 07/01/2016   12/21: neg cmp, DIC panel  . Hepatitis C 06/23/2016  . History of heroin abuse (HCC) 03/15/2013  . History of substance abuse (HCC) 06/22/2016   opioid and heroine- last reported use in July 2017 Positive drug screen on 11/21- cocaine, THC and opiates UDS + for THC, metadone 12/17  . 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 70mg  qday [ ]  NICU consult  . Opioid abuse (HCC) 03/15/2013  . Polysubstance dependence including opioid type drug, episodic abuse (HCC) 03/15/2013      Past Surgical History:  Procedure Laterality Date  . WISDOM TOOTH EXTRACTION        Family History  Problem Relation Age of Onset  . Throat cancer Father       Social History   Socioeconomic History  . Marital status: Single    Spouse name: Not on file  . Number of children: Not on file  . Years of education: Not on file  . Highest education  level: Not on file  Occupational History  . Not on file  Social Needs  . Financial resource strain: Not on file  . Food insecurity:    Worry: Not on file    Inability: Not on file  . Transportation needs:    Medical: Not on file    Non-medical: Not on file  Tobacco Use  . Smoking status: Current Every Day Smoker    Packs/day: 0.25    Years: 5.00    Pack years: 1.25    Types: Cigarettes  . Smokeless tobacco: Never Used  Substance and Sexual Activity  . Alcohol use: No  . Drug use: Not Currently     Types: Marijuana, Heroin    Comment: heroin/opiates  . Sexual activity: Yes    Partners: Male    Birth control/protection: None  Lifestyle  . Physical activity:    Days per week: Not on file    Minutes per session: Not on file  . Stress: Not on file  Relationships  . Social connections:    Talks on phone: Not on file    Gets together: Not on file    Attends religious service: Not on file    Active member of club or organization: Not on file    Attends meetings of clubs or organizations: Not on file    Relationship status: Not on file  . Intimate partner violence:    Fear of current or ex partner: Not on file    Emotionally abused: Not on file    Physically abused: Not on file    Forced sexual activity: Not on file  Other Topics Concern  . Not on file  Social History Narrative  . Not on file      Review of Systems  Constitutional: Negative for chills, fatigue, fever and unexpected weight change.  Respiratory: Negative for cough, chest tightness, shortness of breath and wheezing.   Cardiovascular: Negative for chest pain and leg swelling.  Gastrointestinal: Negative for abdominal distention, constipation, diarrhea, nausea and vomiting.  Neurological: Negative for dizziness, weakness, light-headedness and headaches.  Hematological: Does not bruise/bleed easily.       Objective:    BP 108/69   Pulse 80   Temp 98.4 F (36.9 C) (Oral)   Ht 5\' 7"  (1.702 m)   Wt 112 lb 8 oz (51 kg)   LMP 08/10/2018 (Approximate)   BMI 17.62 kg/m  Nursing note and vital signs reviewed.  Physical Exam Constitutional:      General: She is not in acute distress.    Appearance: She is well-developed.  Cardiovascular:     Rate and Rhythm: Normal rate and regular rhythm.     Heart sounds: Normal heart sounds. No murmur. No friction rub. No gallop.   Pulmonary:     Effort: Pulmonary effort is normal. No respiratory distress.     Breath sounds: Normal breath sounds. No wheezing or rales.   Chest:     Chest wall: No tenderness.  Abdominal:     General: Bowel sounds are normal. There is no distension.     Palpations: Abdomen is soft. There is no mass.     Tenderness: There is no abdominal tenderness. There is no guarding or rebound.  Skin:    General: Skin is warm and dry.  Neurological:     Mental Status: She is alert and oriented to person, place, and time.  Psychiatric:        Behavior: Behavior normal.  Thought Content: Thought content normal.        Judgment: Judgment normal.         Assessment & Plan:   Problem List Items Addressed This Visit      Digestive   Chronic hepatitis C without hepatic coma (HCC) - Primary    Ms. Baudoin has chronic Hepatitis C with genotype 1a and viral load of 65,300 likely obtained from previous IV drug use. She is treatment naive and currently asymptomatic. Discussed the pathogenesis, transmission, risk if left untreated and treatment options of Hepatitis C. Will check Fibrotest for fibrosis score. APRI score of 0.412 and FIB-4 0.72 making liver fibrosis less likely. Plan for treatment with Mavyret for 8 weeks pending results.       Relevant Orders   Hepatitis B surface antibody,qualitative   Liver Fibrosis, FibroTest-ActiTest     Other   Opioid use disorder (HCC)    Stable with counseling and continued use of methadone. Sober from IV drugs for 2-3 years.          I have discontinued Tamsen Pfiffner's CONCEPT OB and ibuprofen. I am also having her maintain her methadone.   Follow-up: Return in about 1 month (around 09/30/2018), or if symptoms worsen or fail to improve.    Marcos Eke, MSN, FNP-C Nurse Practitioner [facility] Office phone: [phone] Pager: [phone] [facility] Main number: [phone]

## 2018-08-31 NOTE — Assessment & Plan Note (Signed)
Ms. Cory RoughenKirkman has chronic Hepatitis C with genotype 1a and viral load of 65,300 likely obtained from previous IV drug use. She is treatment naive and currently asymptomatic. Discussed the pathogenesis, transmission, risk if left untreated and treatment options of Hepatitis C. Will check Fibrotest for fibrosis score. APRI score of 0.412 and FIB-4 0.72 making liver fibrosis less likely. Plan for treatment with Mavyret for 8 weeks pending results.

## 2018-09-05 LAB — LIVER FIBROSIS, FIBROTEST-ACTITEST
ALT: 58 U/L — AB (ref 6–29)
APOLIPOPROTEIN A1: 114 mg/dL (ref 101–198)
Alpha-2-Macroglobulin: 248 mg/dL (ref 106–279)
Bilirubin: 0.5 mg/dL (ref 0.2–1.2)
Fibrosis Score: 0.19
GGT: 31 U/L (ref 3–40)
HAPTOGLOBIN: 148 mg/dL (ref 43–212)
Necroinflammat ACT Score: 0.31
REFERENCE ID: 2835582

## 2018-09-05 LAB — HEPATITIS B SURFACE ANTIBODY,QUALITATIVE: Hep B S Ab: NONREACTIVE

## 2018-09-07 ENCOUNTER — Other Ambulatory Visit: Payer: Self-pay | Admitting: Pharmacist

## 2018-09-07 DIAGNOSIS — B182 Chronic viral hepatitis C: Secondary | ICD-10-CM

## 2018-09-07 MED ORDER — GLECAPREVIR-PIBRENTASVIR 100-40 MG PO TABS: 3.0000 | ORAL_TABLET | Freq: Every day | ORAL | 1 refills | Status: DC

## 2018-09-07 NOTE — Progress Notes (Signed)
Patient's labs have returned, will send in Mavyret x 8 weeks. Kathie Rhodes will start PA process.

## 2018-09-08 ENCOUNTER — Other Ambulatory Visit: Payer: Self-pay | Admitting: Family

## 2018-09-08 ENCOUNTER — Telehealth: Payer: Self-pay | Admitting: Pharmacy Technician

## 2018-09-08 NOTE — Telephone Encounter (Signed)
[facility] Patient Advocate Encounter   Received notification from [facility] Medicaid that prior authorization for Mavyret is required.   PA submitted on 09/08/2018 Key [id number] W Status is pending    [facility] will continue to follow.  Beulah Gandy, CPhT Specialty Pharmacy Patient [facility] for Infectious Disease Phone: [phone] Fax: [phone] 09/08/2018 10:42 AM

## 2018-09-12 ENCOUNTER — Telehealth: Payer: Self-pay | Admitting: Pharmacist

## 2018-09-12 ENCOUNTER — Telehealth: Payer: Self-pay | Admitting: Pharmacy Technician

## 2018-09-12 ENCOUNTER — Encounter: Payer: Self-pay | Admitting: Pharmacy Technician

## 2018-09-12 MED FILL — MAVYRET 100-40 MG TABS: 100-40 | 28 days supply | Qty: 84 | Fill #0

## 2018-09-12 NOTE — Telephone Encounter (Signed)
[facility] Patient Advocate Encounter  Prior Authorization for Mavyret has been approved.    PA# [id number] Effective dates: 09/11/2018 through 11/10/2018  Patients co-pay is $3.00.   [facility] will continue to follow.  Netty Starring. Dimas Aguas CPhT Specialty Pharmacy Patient [facility] for Infectious Disease Phone: [phone] Fax:  [phone]

## 2018-09-12 NOTE — Telephone Encounter (Signed)
Awesome! Thanks

## 2018-09-12 NOTE — Telephone Encounter (Signed)
Patient is approved to receive Mavyret x 8 weeks for chronic Hepatitis C infection. Counseled patient to take all three tablets of Mavyret daily with food.  Counseled patient the need to take all three tablets together and to not separate them out during the day. Encouraged patient not to miss any doses and explained how their chance of cure could go down with each dose missed. Counseled patient on what to do if dose is missed - if it is closer to the missed dose take immediately; if closer to next dose then skip dose and take the next dose at the usual time.   Counseled patient on common symptoms including headache, fatigue, and nausea and that the symptoms normally decrease with time. I reviewed patient medications and found no drug interactions. Discussed with patient that there are several drug interactions with Mavyret and instructed patient to call the clinic if she wishes to start a new medication during course of therapy. Also advised patient to call if she experiences any side effects. Patient will follow-up with me in the pharmacy clinic on 3/19.

## 2018-10-10 MED FILL — MAVYRET 100-40 MG TABS: 100-40 | 28 days supply | Qty: 84 | Fill #1

## 2018-10-19 ENCOUNTER — Ambulatory Visit: Payer: Medicaid Other | Admitting: Pharmacist

## 2018-11-22 ENCOUNTER — Encounter: Payer: Self-pay | Admitting: Pharmacy Technician

## 2019-02-20 ENCOUNTER — Other Ambulatory Visit: Payer: Medicaid Other | Admitting: Women's Health

## 2019-04-18 ENCOUNTER — Other Ambulatory Visit: Payer: Medicaid Other | Admitting: Adult Health

## 2021-03-03 ENCOUNTER — Encounter: Payer: Medicaid Other | Admitting: Obstetrics & Gynecology

## 2021-04-07 ENCOUNTER — Encounter: Payer: Medicaid Other | Admitting: Obstetrics and Gynecology

## 2021-05-11 ENCOUNTER — Encounter: Payer: Self-pay | Admitting: Obstetrics and Gynecology

## 2021-05-11 ENCOUNTER — Encounter: Payer: Medicaid Other | Admitting: Obstetrics and Gynecology

## 2021-05-13 NOTE — Progress Notes (Signed)
Patient did not keep her GYN appointment for 05/11/2021.  Cornelia Copa MD Attending Center for [employer] [facility])

## 2021-11-22 ENCOUNTER — Emergency Department (HOSPITAL_COMMUNITY): Payer: Medicaid Other

## 2021-11-22 ENCOUNTER — Other Ambulatory Visit: Payer: Self-pay

## 2021-11-22 ENCOUNTER — Inpatient Hospital Stay (HOSPITAL_COMMUNITY)
Admission: EM | Admit: 2021-11-22 | Discharge: 2021-11-24 | DRG: 872 | Disposition: A | Discharge status: Home / Self Care | Payer: Medicaid Other | Attending: Family Medicine | Admitting: Family Medicine

## 2021-11-22 ENCOUNTER — Encounter (HOSPITAL_COMMUNITY): Payer: Self-pay | Admitting: Emergency Medicine

## 2021-11-22 DIAGNOSIS — F1190 Opioid use, unspecified, uncomplicated: Secondary | ICD-10-CM | POA: Diagnosis present

## 2021-11-22 DIAGNOSIS — E876 Hypokalemia: Secondary | ICD-10-CM | POA: Diagnosis present

## 2021-11-22 DIAGNOSIS — N179 Acute kidney failure, unspecified: Secondary | ICD-10-CM | POA: Diagnosis not present

## 2021-11-22 DIAGNOSIS — E860 Dehydration: Secondary | ICD-10-CM | POA: Diagnosis present

## 2021-11-22 DIAGNOSIS — A4151 Sepsis due to Escherichia coli [E. coli]: Principal | ICD-10-CM | POA: Diagnosis present

## 2021-11-22 DIAGNOSIS — E871 Hypo-osmolality and hyponatremia: Secondary | ICD-10-CM | POA: Diagnosis present

## 2021-11-22 DIAGNOSIS — N10 Acute tubulo-interstitial nephritis: Secondary | ICD-10-CM | POA: Diagnosis present

## 2021-11-22 DIAGNOSIS — R6520 Severe sepsis without septic shock: Secondary | ICD-10-CM

## 2021-11-22 DIAGNOSIS — N12 Tubulo-interstitial nephritis, not specified as acute or chronic: Secondary | ICD-10-CM | POA: Diagnosis present

## 2021-11-22 DIAGNOSIS — R636 Underweight: Secondary | ICD-10-CM | POA: Diagnosis present

## 2021-11-22 DIAGNOSIS — B182 Chronic viral hepatitis C: Secondary | ICD-10-CM | POA: Diagnosis present

## 2021-11-22 DIAGNOSIS — F17210 Nicotine dependence, cigarettes, uncomplicated: Secondary | ICD-10-CM | POA: Diagnosis present

## 2021-11-22 DIAGNOSIS — Z882 Allergy status to sulfonamides status: Secondary | ICD-10-CM

## 2021-11-22 DIAGNOSIS — A419 Sepsis, unspecified organism: Secondary | ICD-10-CM | POA: Diagnosis present

## 2021-11-22 DIAGNOSIS — Z681 Body mass index (BMI) 19 or less, adult: Secondary | ICD-10-CM

## 2021-11-22 DIAGNOSIS — E872 Acidosis, unspecified: Secondary | ICD-10-CM | POA: Diagnosis present

## 2021-11-22 DIAGNOSIS — D649 Anemia, unspecified: Secondary | ICD-10-CM | POA: Diagnosis present

## 2021-11-22 DIAGNOSIS — Z79899 Other long term (current) drug therapy: Secondary | ICD-10-CM

## 2021-11-22 DIAGNOSIS — Z880 Allergy status to penicillin: Secondary | ICD-10-CM

## 2021-11-22 LAB — URINALYSIS, ROUTINE W REFLEX MICROSCOPIC
Bilirubin Urine: NEGATIVE
Glucose, UA: NEGATIVE mg/dL
Ketones, ur: 5 mg/dL — AB
Nitrite: NEGATIVE
Protein, ur: 100 mg/dL — AB
Specific Gravity, Urine: 1.017 (ref 1.005–1.030)
WBC, UA: >50 WBC/hpf — ABNORMAL HIGH (ref 0–5)
pH: 5 (ref 5.0–8.0)

## 2021-11-22 LAB — CBC
HCT: 43.1 % (ref 36.0–46.0)
Hemoglobin: 14.7 g/dL (ref 12.0–15.0)
MCH: 30.2 pg (ref 26.0–34.0)
MCHC: 34.1 g/dL (ref 30.0–36.0)
MCV: 88.7 fL (ref 80.0–100.0)
Platelets: 158 10*3/uL (ref 150–400)
RBC: 4.86 MIL/uL (ref 3.87–5.11)
RDW: 12.6 % (ref 11.5–15.5)
WBC: 26 10*3/uL — ABNORMAL HIGH (ref 4.0–10.5)
nRBC: 0 % (ref 0.0–0.2)

## 2021-11-22 LAB — COMPREHENSIVE METABOLIC PANEL
ALT: 18 U/L (ref 0–44)
AST: 36 U/L (ref 15–41)
Albumin: 3.4 g/dL — ABNORMAL LOW (ref 3.5–5.0)
Alkaline Phosphatase: 80 U/L (ref 38–126)
Anion gap: 16 — ABNORMAL HIGH (ref 5–15)
BUN: 25 mg/dL — ABNORMAL HIGH (ref 6–20)
CO2: 23 mmol/L (ref 22–32)
Calcium: 8.8 mg/dL — ABNORMAL LOW (ref 8.9–10.3)
Chloride: 93 mmol/L — ABNORMAL LOW (ref 98–111)
Creatinine, Ser: 1.59 mg/dL — ABNORMAL HIGH (ref 0.44–1.00)
GFR, Estimated: 45 mL/min — ABNORMAL LOW (ref >60)
Glucose, Bld: 178 mg/dL — ABNORMAL HIGH (ref 70–99)
Potassium: 3.3 mmol/L — ABNORMAL LOW (ref 3.5–5.1)
Sodium: 132 mmol/L — ABNORMAL LOW (ref 135–145)
Total Bilirubin: 0.9 mg/dL (ref 0.3–1.2)
Total Protein: 6.9 g/dL (ref 6.5–8.1)

## 2021-11-22 LAB — I-STAT BETA HCG BLOOD, ED (MC, WL, AP ONLY): I-stat hCG, quantitative: <5 m[IU]/mL (ref <5)

## 2021-11-22 LAB — LACTIC ACID, PLASMA
Lactic Acid, Venous: 1.9 mmol/L (ref 0.5–1.9)
Lactic Acid, Venous: 2.1 mmol/L (ref 0.5–1.9)

## 2021-11-22 LAB — LIPASE, BLOOD: Lipase: 19 U/L (ref 11–51)

## 2021-11-22 MED ORDER — IOHEXOL 300 MG/ML  SOLN
100.0000 mL | Freq: Once | INTRAMUSCULAR | Status: AC | PRN
  Administered 2021-11-22: 100 mL via INTRAVENOUS

## 2021-11-22 MED ORDER — ACETAMINOPHEN 10 MG/ML IV SOLN
1000.0000 mg | Freq: Once | INTRAVENOUS | Status: DC
  Filled 2021-11-22: qty 100

## 2021-11-22 MED ORDER — SODIUM CHLORIDE 0.9 % IV SOLN: INTRAVENOUS | Status: AC

## 2021-11-22 MED ORDER — HYDROMORPHONE HCL 1 MG/ML IJ SOLN
1.0000 mg | Freq: Once | INTRAMUSCULAR | Status: AC
  Administered 2021-11-22: 1 mg via INTRAVENOUS
  Filled 2021-11-22: qty 1

## 2021-11-22 MED ORDER — ACETAMINOPHEN 650 MG RE SUPP
650.0000 mg | RECTAL | Status: DC
Start: 2021-11-22 — End: 2021-11-23
  Administered 2021-11-22: 650 mg via RECTAL
  Filled 2021-11-22 (×2): qty 1

## 2021-11-22 MED ORDER — PROCHLORPERAZINE EDISYLATE 10 MG/2ML IJ SOLN
10.0000 mg | Freq: Four times a day (QID) | INTRAMUSCULAR | Status: DC | PRN
  Administered 2021-11-23 (×2): 10 mg via INTRAVENOUS
  Filled 2021-11-22 (×2): qty 2

## 2021-11-22 MED ORDER — POTASSIUM CHLORIDE 10 MEQ/100ML IV SOLN
10.0000 meq | Freq: Once | INTRAVENOUS | Status: DC
  Filled 2021-11-22: qty 100

## 2021-11-22 MED ORDER — ONDANSETRON HCL 4 MG/2ML IJ SOLN
INTRAMUSCULAR | Status: AC
  Administered 2021-11-22: 4 mg
  Filled 2021-11-22: qty 2

## 2021-11-22 MED ORDER — POTASSIUM CHLORIDE 10 MEQ/100ML IV SOLN
10.0000 meq | INTRAVENOUS | Status: AC
  Administered 2021-11-22 (×2): 10 meq via INTRAVENOUS
  Filled 2021-11-22: qty 100

## 2021-11-22 MED ORDER — SODIUM CHLORIDE 0.9 % IV SOLN
1.0000 g | Freq: Every day | INTRAVENOUS | Status: DC
  Administered 2021-11-23: 1 g via INTRAVENOUS
  Filled 2021-11-22: qty 10

## 2021-11-22 MED ORDER — ACETAMINOPHEN 10 MG/ML IV SOLN: 1000.0000 mg | Freq: Four times a day (QID) | INTRAVENOUS | Status: DC | PRN

## 2021-11-22 MED ORDER — SODIUM CHLORIDE 0.9 % IV BOLUS
1000.0000 mL | Freq: Once | INTRAVENOUS | Status: AC
  Administered 2021-11-22: 1000 mL via INTRAVENOUS

## 2021-11-22 MED ORDER — ACETAMINOPHEN 650 MG RE SUPP
650.0000 mg | Freq: Once | RECTAL | Status: AC
  Administered 2021-11-22: 650 mg via RECTAL
  Filled 2021-11-22: qty 1

## 2021-11-22 MED ORDER — SODIUM CHLORIDE 0.9 % IV SOLN
2.0000 g | Freq: Once | INTRAVENOUS | Status: AC
  Administered 2021-11-22: 2 g via INTRAVENOUS
  Filled 2021-11-22: qty 20

## 2021-11-22 MED ORDER — ACETAMINOPHEN 500 MG PO TABS
1000.0000 mg | ORAL_TABLET | Freq: Four times a day (QID) | ORAL | Status: DC | PRN
  Filled 2021-11-22: qty 2

## 2021-11-22 MED ORDER — MORPHINE SULFATE (PF) 4 MG/ML IV SOLN: 4.0000 mg | Freq: Once | INTRAVENOUS | Status: DC

## 2021-11-22 MED ORDER — ACETAMINOPHEN 10 MG/ML IV SOLN
1000.0000 mg | Freq: Four times a day (QID) | INTRAVENOUS | Status: DC | PRN
  Filled 2021-11-22: qty 100

## 2021-11-22 MED ORDER — METOCLOPRAMIDE HCL 5 MG/ML IJ SOLN
10.0000 mg | Freq: Once | INTRAMUSCULAR | Status: AC
  Administered 2021-11-22: 10 mg via INTRAVENOUS
  Filled 2021-11-22: qty 2

## 2021-11-22 NOTE — Sepsis Progress Note (Signed)
Elink following code sepsis °

## 2021-11-22 NOTE — Sepsis Progress Note (Signed)
Message sent to bedside RN asking if barriers were preventing code sepsis being carried out, RN replied back patient was a difficult stick and they were actively trying to obtain labs prior to starting abx. ? ? ?

## 2021-11-22 NOTE — ED Notes (Signed)
MD notified of lactic of 2.1 ?

## 2021-11-22 NOTE — ED Notes (Signed)
Called 6E Re purple man ? ?

## 2021-11-22 NOTE — ED Provider Notes (Signed)
Care assumed from Delice Bison, PA-C, at shift change, please see their notes for full documentation of patient's complaint/HPI. Briefly, pt here with complaint of RLQ abd pain with associated nausea, vomiting, diarrhea, dysuria, and urinary frequency. Results so far show significant leukocytosis at 26,000 and CMP with creatinine 1.59 (no previous to compare to) Awaiting urinalysis and CT scan with concern for possible UTI vs pyelo vs appendicitis. Plan is to dispo accordingly.  ? ?Physical Exam  ?BP 117/79   Pulse (!) 125   Temp (!) 102.2 ?F (39 ?C) (Oral)   Resp (!) 22   LMP 11/08/2021   SpO2 96%  ? ?Physical Exam ?Vitals and nursing note reviewed.  ?Constitutional:   ?   Appearance: She is not ill-appearing.  ?HENT:  ?   Head: Normocephalic and atraumatic.  ?Eyes:  ?   Conjunctiva/sclera: Conjunctivae normal.  ?Cardiovascular:  ?   Rate and Rhythm: Regular rhythm. Tachycardia present.  ?Pulmonary:  ?   Effort: Pulmonary effort is normal.  ?   Breath sounds: Normal breath sounds.  ?Abdominal:  ?   Tenderness: There is abdominal tenderness in the right lower quadrant and suprapubic area. There is right CVA tenderness. There is no guarding or rebound.  ?Skin: ?   General: Skin is warm and dry.  ?   Coloration: Skin is not jaundiced.  ?Neurological:  ?   Mental Status: She is alert.  ? ? ?Procedures  ?Marland KitchenCritical Care ?Performed by: Tanda Rockers, PA-C ?Authorized by: Tanda Rockers, PA-C  ? ?Critical care provider statement:  ?  Critical care time (minutes):  35 ?  Critical care was necessary to treat or prevent imminent or life-threatening deterioration of the following conditions:  Sepsis ?  Critical care was time spent personally by me on the following activities:  Development of treatment plan with patient or surrogate, discussions with consultants, evaluation of patient's response to treatment, examination of patient, ordering and review of laboratory studies, ordering and review of radiographic  studies, ordering and performing treatments and interventions, pulse oximetry, re-evaluation of patient's condition and review of old charts ? ?ED Course / MDM  ?  ?Medical Decision Making ?Patient presenting initially afebrile however repeat temperature of 102.2 and tachycardic in the 120s to 130s.  Fluids have not been provided, will provide at this time.  She was provided Tylenol for her fever however unfortunately vomited it up.  Additional nausea medication provided as well as suppository Tylenol.  Given significant leukocytosis, fever, tachycardia patient meets sepsis criteria at this time.  Blood cultures ordered as well as lactic acid.  Will start on IV antibiotics.  Will await CT scan and urinalysis with further evaluation. ? ?Urinalysis with moderate leuks, greater than 50 white blood cells, 11-20 red blood cells, rare bacteria.  ? ?CT: ?IMPRESSION:  ?1. Enlarged RIGHT kidney with striated appearance/multiple  ?wedge-shaped peripheral hypodense areas and RIGHT perinephric  ?stranding. Small wedge-shaped areas of hypodensity within the MEDIAL  ?LEFT kidney. This findings most likely represents  ?infection/pyelonephritis but correlate with labs. No discrete  ?abscess, renal gas or hydronephrosis.  ?2. No other acute abnormalities.  ? ?Patient started on IV Rocephin for pyelonephritis. On reevaluation pt continues to be tachycardic in the 110-120 range. Will reevaluate after fluids however given bilateral pyelonephritis with AKI do feel pt requires admission at this time.   ? ?Discussed case with Family Medicine Team who will admit patient at this time. Appreciate their involvement.  ? ? ?Amount and/or Complexity of Data Reviewed ?Labs:  ordered. ?Radiology: ordered. ? ?Risk ?OTC drugs. ?Prescription drug management. ?Decision regarding hospitalization. ? ?Critical Care ?Total time providing critical care: 35 minutes ? ? ? ? ? ? ?  ?Tanda Rockers, PA-C ?11/22/21 1931 ? ?  ?Cathren Laine, MD ?11/22/21  2006 ? ?

## 2021-11-22 NOTE — ED Triage Notes (Signed)
C/o RLQ pain with nausea, vomiting, diarrhea, dysuria, and urinary frequency x 3 days.  HR 130-140. ?

## 2021-11-22 NOTE — ED Provider Notes (Addendum)
?[facility] ?Provider Note ? ? ?CSN: [id number] ?Arrival date & time: 11/22/21  1413 ? ?  ? ?History ? ?Chief Complaint  ?Patient presents with  ? Abdominal Pain  ? ? ?Nicole MulletKatlyn Moss is a 31 y.o. female who presents to ED for evaluation of abdominal pain. Patient reports that for the last 3 days she has had abdominal pain, nausea, vomiting, diarrhea, dysuria and frequency. Patient states her urine also has foul smell to it. Patient also endorsing fevers and body chills. Patient states her appetite has been decreased for last 3 days and she believes she has UTI.  ? ? ?Abdominal Pain ?Associated symptoms: chills, diarrhea, dysuria, fever, nausea and vomiting   ?Associated symptoms: no vaginal discharge   ? ?  ? ?Home Medications ?Prior to Admission medications   ?Medication Sig Start Date End Date Taking? Authorizing Provider  ?Glecaprevir-Pibrentasvir (MAVYRET) 100-40 MG TABS Take 3 tablets by mouth daily with breakfast. 09/07/18   Kuppelweiser, Cassie L, RPH-CPP  ?methadone (DOLOPHINE) 10 MG/ML solution Take 120 mg by mouth daily.    [provider]  ?   ? ?Allergies    ?Penicillins and Sulfa antibiotics   ? ?Review of Systems   ?Review of Systems  ?Constitutional:  Positive for chills and fever.  ?Gastrointestinal:  Positive for abdominal pain, diarrhea, nausea and vomiting.  ?Genitourinary:  Positive for dysuria, flank pain and frequency. Negative for vaginal discharge.  ?All other systems reviewed and are negative. ? ?Physical Exam ?Updated Vital Signs ?BP 117/79   Pulse (!) 125   Temp (!) 102.2 ?F (39 ?C) (Oral)   Resp (!) 22   LMP 11/08/2021   SpO2 96%  ?Physical Exam ?Vitals and nursing note reviewed.  ?Constitutional:   ?   General: She is not in acute distress. ?   Appearance: She is ill-appearing, toxic-appearing and diaphoretic.  ?HENT:  ?   Head: Normocephalic and atraumatic.  ?   Mouth/Throat:  ?   Mouth: Mucous membranes are moist.  ?Eyes:  ?    Extraocular Movements: Extraocular movements intact.  ?   Pupils: Pupils are equal, round, and reactive to light.  ?Cardiovascular:  ?   Rate and Rhythm: Regular rhythm. Tachycardia present.  ?Pulmonary:  ?   Effort: Pulmonary effort is normal.  ?   Breath sounds: Normal breath sounds. No wheezing.  ?Abdominal:  ?   General: Abdomen is flat. Bowel sounds are normal.  ?   Palpations: Abdomen is soft.  ?   Tenderness: There is abdominal tenderness in the right lower quadrant. There is right CVA tenderness and left CVA tenderness.  ?Skin: ?   General: Skin is warm.  ?   Capillary Refill: Capillary refill takes less than 2 seconds.  ?Neurological:  ?   Mental Status: She is alert and oriented to person, place, and time.  ? ? ?ED Results / Procedures / Treatments   ?Labs ?(all labs ordered are listed, but only abnormal results are displayed) ?Labs Reviewed  ?COMPREHENSIVE METABOLIC PANEL - Abnormal; Notable for the following components:  ?    Result Value  ? Sodium 132 (*)   ? Potassium 3.3 (*)   ? Chloride 93 (*)   ? Glucose, Bld 178 (*)   ? BUN 25 (*)   ? Creatinine, Ser 1.59 (*)   ? Calcium 8.8 (*)   ? Albumin 3.4 (*)   ? GFR, Estimated 45 (*)   ? Anion gap 16 (*)   ?  All other components within normal limits  ?CBC - Abnormal; Notable for the following components:  ? WBC 26.0 (*)   ? All other components within normal limits  ?URINALYSIS, ROUTINE W REFLEX MICROSCOPIC - Abnormal; Notable for the following components:  ? Color, Urine AMBER (*)   ? APPearance CLOUDY (*)   ? Hgb urine dipstick MODERATE (*)   ? Ketones, ur 5 (*)   ? Protein, ur 100 (*)   ? Leukocytes,Ua MODERATE (*)   ? WBC, UA >50 (*)   ? Bacteria, UA RARE (*)   ? All other components within normal limits  ?CULTURE, BLOOD (ROUTINE X 2)  ?CULTURE, BLOOD (ROUTINE X 2)  ?URINE CULTURE  ?LIPASE, BLOOD  ?LACTIC ACID, PLASMA  ?LACTIC ACID, PLASMA  ?I-STAT BETA HCG BLOOD, ED ([facility], [facility], [facility] ONLY)  ? ? ?EKG ?None ? ?Radiology ?CT ABDOMEN PELVIS W CONTRAST ? ?Result  Date: 11/22/2021 ?CLINICAL DATA:  31 year old female with acute RIGHT abdominal and pelvic pain. EXAM: CT ABDOMEN AND PELVIS WITH CONTRAST TECHNIQUE: Multidetector CT imaging of the abdomen and pelvis was performed using the standard protocol following bolus administration of intravenous contrast. RADIATION DOSE REDUCTION: This exam was performed according to the departmental dose-optimization program which includes automated exposure control, adjustment of the mA and/or kV according to patient size and/or use of iterative reconstruction technique. CONTRAST:  OMNIPAQUE IOHEXOL 300 MG/ML  SOLN COMPARISON:  03/04/2015 CT FINDINGS: Lower chest: No acute abnormality. Hepatobiliary: The liver and gallbladder are unremarkable. There is no evidence of intrahepatic or extrahepatic biliary dilatation. Pancreas: Unremarkable Spleen: Unremarkable Adrenals/Urinary Tract: The RIGHT kidney is enlarged with a striated appearance/multiple wedge-shaped peripheral hypodense areas and RIGHT perinephric stranding. Small hypodense wedge-shaped areas within the MEDIAL LEFT kidney are also noted. This findings most likely represents infection/pyelonephritis. No discrete abscess, renal gas or hydronephrosis. The renal arteries and veins appear patent. The adrenal glands are unremarkable.  The bladder is collapsed. Stomach/Bowel: Stomach is within normal limits. Appendix appears normal. No evidence of bowel wall thickening, distention, or inflammatory changes. Vascular/Lymphatic: No significant vascular findings are present. No enlarged abdominal or pelvic lymph nodes. Reproductive: Uterus and bilateral adnexa are unremarkable except for an IUD. Other: No ascites, focal collection or pneumoperitoneum. Musculoskeletal: No acute or suspicious bony abnormalities are noted. Remote compression fracture of T12 again noted. IMPRESSION: 1. Enlarged RIGHT kidney with striated appearance/multiple wedge-shaped peripheral hypodense areas and RIGHT  perinephric stranding. Small wedge-shaped areas of hypodensity within the MEDIAL LEFT kidney. This findings most likely represents infection/pyelonephritis but correlate with labs. No discrete abscess, renal gas or hydronephrosis. 2. No other acute abnormalities. Electronically Signed   By: Harmon Pier M.D.   On: 11/22/2021 16:12   ? ?Procedures ?Procedures  ? ? ?Medications Ordered in ED ?Medications  ?acetaminophen (TYLENOL) tablet 1,000 mg ( Oral Canceled Entry 11/22/21 1557)  ?cefTRIAXone (ROCEPHIN) 2 g in sodium chloride 0.9 % 100 mL IVPB (has no administration in time range)  ?potassium chloride 10 mEq in 100 mL IVPB (has no administration in time range)  ?0.9 %  sodium chloride infusion (has no administration in time range)  ?ondansetron (ZOFRAN) 4 MG/2ML injection (4 mg  Given 11/22/21 1505)  ?HYDROmorphone (DILAUDID) injection 1 mg (1 mg Intravenous Given 11/22/21 1529)  ?iohexol (OMNIPAQUE) 300 MG/ML solution 100 mL (100 mLs Intravenous Contrast Given 11/22/21 1536)  ?sodium chloride 0.9 % bolus 1,000 mL (0 mLs Intravenous Stopped 11/22/21 1800)  ?metoCLOPramide (REGLAN) injection 10 mg (10 mg Intravenous Given 11/22/21 1758)  ?  acetaminophen (TYLENOL) suppository 650 mg (650 mg Rectal Given 11/22/21 1802)  ?sodium chloride 0.9 % bolus 1,000 mL (1,000 mLs Intravenous New Bag/Given 11/22/21 1802)  ? ? ?ED Course/ Medical Decision Making/ A&P ?  ?                        ?Medical Decision Making ?Amount and/or Complexity of Data Reviewed ?Labs: ordered. ?Radiology: ordered. ? ?Risk ?OTC drugs. ?Prescription drug management. ?Decision regarding hospitalization. ? ? ?30YOF presents to ED for evaluation of abdominal pain. Please see HPI for further details. ? ?On examination patient is diaphoretic. Patient tachycardic to 120. Patient nonhypoxic on room air. Patient lung sounds clear. Patient has bilateral CVA tenderness. ? ?Patient worked up utilizing following labs and imaging studies interpreted by me: ?-CBC shows  elevated WBC count to 26 ?-CMP shows elevated creatinine to 1.59, potassium 3.3, sodium 132. Patient treated with 1L normal saline ?-Lipase unremarkable ?-Urinalysis shows bacteria, leukocytes, protein ?-CT abdomen has

## 2021-11-22 NOTE — H&P (Addendum)
Family Medicine Teaching Service ?Hospital Admission History and Physical ?Service Pager: [phone] ? ?Patient name: Nicole Moss Medical record number: [id number] ?Date of birth: 08/08/1990 Age: 31 y.o. Gender: female ? ?Primary Care Provider: Pcp, No ?Consultants: None ?Code Status: full ?Preferred Emergency Contact: mother (cell number is [phone] ?Contact Information   ? ? Name Relation Home Work Mobile  ? Poindexter,Dana Mother [phone]    ? ?  ? ? ?Chief Complaint: Abdominal pain ? ?Assessment and Plan: ?Nicole Moss is a 31 y.o. female presenting with 3 day progressive abdominal pain, nausea, vomiting, diarrhea, dysuria, foul smelling urine, and frequency. PMH is significant for opiate use disorder on methadone and Chronic Hepatitis C. ? ?Abdominal Pain ?AKI ?Acute Pyelonephritis secondary to untreated UTI ?Patient endorses dysuria, foul smelling urine, frequency approximately 1 week ago. Progressed to having severe abdominal pain 3 days ago with chills, nausea, diarrhea, and vomiting. Upon arrival to ED, patient had 10/10 abdominal pain. Patient was initially afebrile but then spiked fever 102.2, tachypneic, tachycardic 120s, but normotensive. WBC 26.0, Lactic acid 1.9>2.1, UA moderate leucocytes, moderate hgb, protein 100, ketone 5, and WBC >50 with WBC clumps, mucus, and rare bacteria. Cr also elevated to 1.59, baseline ~0.6. Negative beta hcg, lipase normal. Code sepsis was called and patient received IV Ceftriaxone. Received 2 L fluid bolus and now on continuous 150 ml/hr. Abdominal CT w/ contrast showed enlarged right kidney with multiple wedge shaped hypodensity with perinephric stranding and small wedge-shaped areas of hypodensity within medial L kidney consistent with pyelonephritis. No discrete abscess, renal gas or hydronephrosis. No other acute abnormalities identified on CT abdomen. Patient is unable to tolerate PO medications at this time as she vomited when given PO tylenol. Will make  all meds IV/IM for now. Will also get ECG to assess Qtc. ?-Admit to FPTS, progressive, attending Dr. McDiarmid ?-Vitals per floor ?-mIVF 150 ml/hr ?-Encourage PO hydration as able ?-Continue IV ceftriaxone per pharmacy ?-2nd lactic acid pending ?-Urine culture in process, blood culture x2 collected ?-ECG ?-IV Compazine 10 mg q6 hour prn  ?-Scheduled Tylenol suppository 650 mg q6 hour for fever and pain ?-AM BMP/CBC ? ?Electrolyte derrangment ?Hypokalemia ?Likely in setting of poor PO intake and vomiting. K 3.3. Received 10 meq IV Kcl in ED. Will add on 20 meq more. ?-mIVF 150 ml/hr x 24 hour ?-IV Kcl 20 meq ?-AM BMP ? ?Opiate use Disorder ?On methadone. Last dose was 50 mg on Friday per patient. Received dose of dilaudid 1 mg at 3:30 PM. ?-Will wait for pharm rec before resuming methadone, may require re-titrating. ?-Avoid opiates for pain as able ? ?Chronic Hepatitis C ?Did take mavyret at some point for hep C. Unclear if completed. Does not have PCP.  ?-OP follow up ?-TOC consult for PCP needs ? ?FEN/GI: Regular ?Prophylaxis: none, Padua 0 ? ?Disposition: progressive ? ?History of Present Illness:  Nicole Moss is a 31 y.o. female presenting with progressively worsening abdominal pain. ? ?Patient states she had symptoms of dysuria, malodorous urine, and urinary frequency for the past 5-6 days. She believes she had a UTI but never had her urine tested and did not seek treatment. She started having constant abdominal pain 3 days ago worse in the right flank associated with vomiting and diarrhea. Pain initially 10/10 on arrival to ED but is now rated at a 6-7 currently. She estimates 1-2 episodes of diarrhea per day. Reports fever and chills started 2 days ago. She has had decreased oral intake. She tried to take Tylenol  but vomited it up. ? ?No PCP per patient. She goes to the methadone clinic, takes 50 mg methadone. Last dose was 2 days ago. Smokes 1/2 ppd, denies alcohol, vaping or illicit substance  use ? ?Review Of Systems: Per HPI with the following additions:  ? ?Review of Systems  ?Constitutional:  Positive for chills and fatigue. Negative for activity change and appetite change.  ?HENT:  Negative for congestion, drooling, facial swelling, hearing loss, sore throat and voice change.   ?Respiratory:  Negative for cough and shortness of breath.   ?Cardiovascular:  Negative for chest pain.  ?Gastrointestinal:  Positive for abdominal pain, diarrhea, nausea and vomiting. Negative for abdominal distention.  ?Genitourinary:  Positive for dysuria, flank pain, frequency and urgency.  ?Musculoskeletal:  Negative for myalgias.   ? ?Patient Active Problem List  ? Diagnosis Date Noted  ? Chronic hepatitis C without hepatic coma (HCC) 08/31/2018  ? Opioid use disorder 08/31/2018  ? ? ?Past Medical History: ?Past Medical History:  ?Diagnosis Date  ? Addiction to drug [facility])   ? Anorexia   ? Chronic hepatitis C complicating pregnancy, antepartum (HCC) 07/01/2016  ? 12/21: neg cmp, DIC panel  ? Hepatitis C 06/23/2016  ? History of heroin abuse (HCC) 03/15/2013  ? History of substance abuse (HCC) 06/22/2016  ? opioid and heroine- last reported use in July 2017 Positive drug screen on 11/21- cocaine, THC and opiates UDS + for THC, metadone 12/17  ? 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 12/21: goes to [facility] in [place]. 70mg  qday [ ]  NICU consult  ? Opioid abuse (HCC) 03/15/2013  ? Polysubstance dependence including opioid type drug, episodic abuse (HCC) 03/15/2013  ? ? ?Past Surgical History: ?Past Surgical History:  ?Procedure Laterality Date  ? WISDOM TOOTH EXTRACTION    ? ? ?Social History: ?Social History  ? ?Tobacco Use  ? Smoking status: Every Day  ?  Packs/day: 0.25  ?  Years: 5.00  ?  Pack years: 1.25  ?  Types: Cigarettes  ? Smokeless tobacco: Never  ?Vaping Use  ? Vaping Use: Never used  ?Substance Use Topics  ? Alcohol use: No  ? Drug use: Not Currently  ?  Types: Marijuana, Heroin  ?  Comment: heroin/opiates   ? ?Additional social history: smokes 0.5 ppd for the past 5 years. Denies alcohol use.   ?Please also refer to relevant sections of EMR. ? ?Family History: ?Family History  ?Problem Relation Age of Onset  ? Throat cancer Father   ? ? ? ?Allergies and Medications: ?Allergies  ?Allergen Reactions  ? Penicillins Nausea And Vomiting  ?  Has patient had a PCN reaction causing immediate rash, facial/tongue/throat swelling, SOB or lightheadedness with hypotension: No ?Has patient had a PCN reaction causing severe rash involving mucus membranes or skin necrosis: No ?Has patient had a PCN reaction that required hospitalization No ?Has patient had a PCN reaction occurring within the last 10 years: Yes ?If all of the above answers are "NO", then may proceed with Cephalosporin use. ?  ? Sulfa Antibiotics Nausea And Vomiting  ? ?No current facility-administered medications on file prior to encounter.  ? ?Current Outpatient Medications on File Prior to Encounter  ?Medication Sig Dispense Refill  ? Glecaprevir-Pibrentasvir (MAVYRET) 100-40 MG TABS Take 3 tablets by mouth daily with breakfast. 84 tablet 1  ? methadone (DOLOPHINE) 10 MG/ML solution Take 120 mg by mouth daily.    ? ? ?Objective: ?BP 117/79   Pulse (!) 125   Temp (!) 102.2 ?  F (39 ?C) (Oral)   Resp (!) 22   LMP 11/08/2021   SpO2 96%  ?Exam: ?Physical Exam ?Constitutional:   ?   Appearance: She is normal weight. She is ill-appearing. She is not diaphoretic.  ?HENT:  ?   Head: Normocephalic and atraumatic.  ?Eyes:  ?   Extraocular Movements: Extraocular movements intact.  ?   Pupils: Pupils are equal, round, and reactive to light.  ?Cardiovascular:  ?   Rate and Rhythm: Regular rhythm. Tachycardia present.  ?   Heart sounds: Normal heart sounds. No murmur heard. ?  No gallop.  ?Pulmonary:  ?   Effort: Pulmonary effort is normal. No respiratory distress.  ?   Breath sounds: Normal breath sounds. No wheezing or rales.  ?Abdominal:  ?   General: Bowel sounds are  increased. There is no distension or abdominal bruit.  ?   Palpations: Abdomen is soft. There is no shifting dullness.  ?   Tenderness: There is generalized abdominal tenderness and tenderness in the right lower quadrant and left low

## 2021-11-23 DIAGNOSIS — A4151 Sepsis due to Escherichia coli [E. coli]: Secondary | ICD-10-CM | POA: Diagnosis present

## 2021-11-23 DIAGNOSIS — Z880 Allergy status to penicillin: Secondary | ICD-10-CM | POA: Diagnosis not present

## 2021-11-23 DIAGNOSIS — E872 Acidosis, unspecified: Secondary | ICD-10-CM | POA: Diagnosis present

## 2021-11-23 DIAGNOSIS — N12 Tubulo-interstitial nephritis, not specified as acute or chronic: Secondary | ICD-10-CM | POA: Diagnosis not present

## 2021-11-23 DIAGNOSIS — F1190 Opioid use, unspecified, uncomplicated: Secondary | ICD-10-CM | POA: Diagnosis present

## 2021-11-23 DIAGNOSIS — M7989 Other specified soft tissue disorders: Secondary | ICD-10-CM | POA: Diagnosis not present

## 2021-11-23 DIAGNOSIS — Z882 Allergy status to sulfonamides status: Secondary | ICD-10-CM | POA: Diagnosis not present

## 2021-11-23 DIAGNOSIS — E871 Hypo-osmolality and hyponatremia: Secondary | ICD-10-CM | POA: Diagnosis present

## 2021-11-23 DIAGNOSIS — N10 Acute tubulo-interstitial nephritis: Secondary | ICD-10-CM | POA: Diagnosis present

## 2021-11-23 DIAGNOSIS — Z79899 Other long term (current) drug therapy: Secondary | ICD-10-CM | POA: Diagnosis not present

## 2021-11-23 DIAGNOSIS — E860 Dehydration: Secondary | ICD-10-CM | POA: Diagnosis present

## 2021-11-23 DIAGNOSIS — M79601 Pain in right arm: Secondary | ICD-10-CM | POA: Diagnosis not present

## 2021-11-23 DIAGNOSIS — D649 Anemia, unspecified: Secondary | ICD-10-CM | POA: Diagnosis present

## 2021-11-23 DIAGNOSIS — B182 Chronic viral hepatitis C: Secondary | ICD-10-CM | POA: Diagnosis present

## 2021-11-23 DIAGNOSIS — F17210 Nicotine dependence, cigarettes, uncomplicated: Secondary | ICD-10-CM | POA: Diagnosis present

## 2021-11-23 DIAGNOSIS — R636 Underweight: Secondary | ICD-10-CM | POA: Diagnosis present

## 2021-11-23 DIAGNOSIS — Z681 Body mass index (BMI) 19 or less, adult: Secondary | ICD-10-CM | POA: Diagnosis not present

## 2021-11-23 DIAGNOSIS — E876 Hypokalemia: Secondary | ICD-10-CM | POA: Diagnosis present

## 2021-11-23 DIAGNOSIS — N179 Acute kidney failure, unspecified: Secondary | ICD-10-CM | POA: Diagnosis present

## 2021-11-23 LAB — BLOOD CULTURE ID PANEL (REFLEXED) - BCID2

## 2021-11-23 LAB — BASIC METABOLIC PANEL
Anion gap: 7 (ref 5–15)
BUN: 20 mg/dL (ref 6–20)
CO2: 21 mmol/L — ABNORMAL LOW (ref 22–32)
Calcium: 7.3 mg/dL — ABNORMAL LOW (ref 8.9–10.3)
Chloride: 105 mmol/L (ref 98–111)
Creatinine, Ser: 1.29 mg/dL — ABNORMAL HIGH (ref 0.44–1.00)
GFR, Estimated: 57 mL/min — ABNORMAL LOW (ref >60)
Glucose, Bld: 119 mg/dL — ABNORMAL HIGH (ref 70–99)
Potassium: 3.7 mmol/L (ref 3.5–5.1)
Sodium: 133 mmol/L — ABNORMAL LOW (ref 135–145)

## 2021-11-23 LAB — CBC
HCT: 33.5 % — ABNORMAL LOW (ref 36.0–46.0)
Hemoglobin: 11.5 g/dL — ABNORMAL LOW (ref 12.0–15.0)
MCH: 30.6 pg (ref 26.0–34.0)
MCHC: 34.3 g/dL (ref 30.0–36.0)
MCV: 89.1 fL (ref 80.0–100.0)
Platelets: 89 10*3/uL — ABNORMAL LOW (ref 150–400)
RBC: 3.76 MIL/uL — ABNORMAL LOW (ref 3.87–5.11)
RDW: 12.9 % (ref 11.5–15.5)
WBC: 13.5 10*3/uL — ABNORMAL HIGH (ref 4.0–10.5)
nRBC: 0 % (ref 0.0–0.2)

## 2021-11-23 LAB — MAGNESIUM
Magnesium: 1.3 mg/dL — ABNORMAL LOW (ref 1.7–2.4)
Magnesium: 2 mg/dL (ref 1.7–2.4)

## 2021-11-23 MED ORDER — ACETAMINOPHEN 325 MG PO TABS
650.0000 mg | ORAL_TABLET | Freq: Four times a day (QID) | ORAL | Status: DC | PRN
  Administered 2021-11-23 – 2021-11-24 (×2): 650 mg via ORAL
  Filled 2021-11-23 (×2): qty 2

## 2021-11-23 MED ORDER — SODIUM CHLORIDE 0.9 % IV SOLN
1.0000 g | Freq: Once | INTRAVENOUS | Status: AC
  Administered 2021-11-23: 1 g via INTRAVENOUS
  Filled 2021-11-23: qty 10

## 2021-11-23 MED ORDER — METHADONE HCL 10 MG PO TABS
50.0000 mg | ORAL_TABLET | Freq: Every day | ORAL | Status: DC
  Administered 2021-11-23 – 2021-11-24 (×2): 50 mg via ORAL
  Filled 2021-11-23 (×2): qty 5

## 2021-11-23 MED ORDER — HYDROMORPHONE HCL 1 MG/ML IJ SOLN
0.5000 mg | INTRAMUSCULAR | Status: DC | PRN
  Administered 2021-11-23: 0.5 mg via INTRAVENOUS
  Filled 2021-11-23: qty 1

## 2021-11-23 MED ORDER — ENOXAPARIN SODIUM 40 MG/0.4ML IJ SOSY
40.0000 mg | PREFILLED_SYRINGE | Freq: Every day | INTRAMUSCULAR | Status: DC
  Administered 2021-11-23 – 2021-11-24 (×2): 40 mg via SUBCUTANEOUS
  Filled 2021-11-23 (×2): qty 0.4

## 2021-11-23 MED ORDER — MAGNESIUM SULFATE 2 GM/50ML IV SOLN
2.0000 g | Freq: Once | INTRAVENOUS | Status: AC
  Administered 2021-11-23: 2 g via INTRAVENOUS
  Filled 2021-11-23: qty 50

## 2021-11-23 MED ORDER — SODIUM CHLORIDE 0.9 % IV SOLN
2.0000 g | Freq: Every day | INTRAVENOUS | Status: DC
  Filled 2021-11-23: qty 20

## 2021-11-23 NOTE — Progress Notes (Signed)
FPTS Brief Progress Note ? ?S: Patient is seen and assessed at bedside.  Reports continuing to have abdominal pain 6 out of 10.  Discussed that she has as needed pain medications available to her.  Patient verbalized understanding.  Reports being able to tolerate p.o. fluids at this time. ? ? ?O: ?BP 116/78 (BP Location: Right Arm)   Pulse 98   Temp 98.8 ?F (37.1 ?C) (Oral)   Resp 14   Ht 5\' 10"  (1.778 m)   Wt 54.7 kg   LMP 11/08/2021   SpO2 98%   BMI 17.32 kg/m?   ? ? ?A/P: ?- Regular diet already ordered ?- PRN pain meds: tylenol and dilaudid ?- Plans per day team: Ceftriaxone 2 g for Enterobacterales and E. Coli infection ?- Orders reviewed. Labs for AM ordered, which was adjusted as needed.  ?- If condition changes, plan includes bedside evaluation.  ? ?01/08/2022, MD ?11/23/2021, 11:17 PM ?PGY-1, [place] [facility] Night Resident  ?Please page [phone] with questions.  ? ?

## 2021-11-23 NOTE — Progress Notes (Signed)
PHARMACY - PHYSICIAN COMMUNICATION ?CRITICAL VALUE ALERT - BLOOD CULTURE IDENTIFICATION (BCID) ? ?Nicole Moss is an 31 y.o. female who presented to [facility] on 11/22/2021 with a chief complaint of abdominal pain. ? ?Assessment:  Source likely UTI. WBC elevated at 13.5, but now afebrile.  ? ?Name of physician (or Provider) Contacted: Dr. Zola Button ? ?Current antibiotics: Ceftriaxone 1g daily ? ?Changes to prescribed antibiotics recommended:  ?Give 1 g of ceftriaxone now. Change ceftriaxone to 2g daily. ? ?Results for orders placed or performed during the hospital encounter of 11/22/21  ?Blood Culture ID Panel (Reflexed) (Collected: 11/22/2021  4:09 PM)  ?Result Value Ref Range  ? Enterococcus faecalis NOT DETECTED NOT DETECTED  ? Enterococcus Faecium NOT DETECTED NOT DETECTED  ? Listeria monocytogenes NOT DETECTED NOT DETECTED  ? Staphylococcus species NOT DETECTED NOT DETECTED  ? Staphylococcus aureus (BCID) NOT DETECTED NOT DETECTED  ? Staphylococcus epidermidis NOT DETECTED NOT DETECTED  ? Staphylococcus lugdunensis NOT DETECTED NOT DETECTED  ? Streptococcus species NOT DETECTED NOT DETECTED  ? Streptococcus agalactiae NOT DETECTED NOT DETECTED  ? Streptococcus pneumoniae NOT DETECTED NOT DETECTED  ? Streptococcus pyogenes NOT DETECTED NOT DETECTED  ? A.calcoaceticus-baumannii NOT DETECTED NOT DETECTED  ? Bacteroides fragilis NOT DETECTED NOT DETECTED  ? Enterobacterales DETECTED (A) NOT DETECTED  ? Enterobacter cloacae complex NOT DETECTED NOT DETECTED  ? Escherichia coli DETECTED (A) NOT DETECTED  ? Klebsiella aerogenes NOT DETECTED NOT DETECTED  ? Klebsiella oxytoca NOT DETECTED NOT DETECTED  ? Klebsiella pneumoniae NOT DETECTED NOT DETECTED  ? Proteus species NOT DETECTED NOT DETECTED  ? Salmonella species NOT DETECTED NOT DETECTED  ? Serratia marcescens NOT DETECTED NOT DETECTED  ? Haemophilus influenzae NOT DETECTED NOT DETECTED  ? Neisseria meningitidis NOT DETECTED NOT DETECTED  ? Pseudomonas  aeruginosa NOT DETECTED NOT DETECTED  ? Stenotrophomonas maltophilia NOT DETECTED NOT DETECTED  ? Candida albicans NOT DETECTED NOT DETECTED  ? Candida auris NOT DETECTED NOT DETECTED  ? Candida glabrata NOT DETECTED NOT DETECTED  ? Candida krusei NOT DETECTED NOT DETECTED  ? Candida parapsilosis NOT DETECTED NOT DETECTED  ? Candida tropicalis NOT DETECTED NOT DETECTED  ? Cryptococcus neoformans/gattii NOT DETECTED NOT DETECTED  ? CTX-M ESBL NOT DETECTED NOT DETECTED  ? Carbapenem resistance IMP NOT DETECTED NOT DETECTED  ? Carbapenem resistance KPC NOT DETECTED NOT DETECTED  ? Carbapenem resistance NDM NOT DETECTED NOT DETECTED  ? Carbapenem resist OXA 48 LIKE NOT DETECTED NOT DETECTED  ? Carbapenem resistance VIM NOT DETECTED NOT DETECTED  ? ? ?[name] ?11/23/2021  10:58 AM ? ?

## 2021-11-23 NOTE — Care Management (Signed)
1141 11-23-21 Case Manager received a referral for PCP needs. Case Manager spoke with the patient and she has currently been assigned to [facility]. Patient will need to call and schedule a hospital follow up appointment. No further needs from Case Manager at this time.  ?

## 2021-11-23 NOTE — Plan of Care (Signed)

## 2021-11-23 NOTE — Progress Notes (Signed)
Family Medicine Teaching Service ?Daily Progress Note ?Intern Pager: [phone] ? ?Patient name: Nicole Moss Medical record number: [id number] ?Date of birth: 1990-08-17 Age: 31 y.o. Gender: female ? ?Primary Care Provider: Pcp, No ?Consultants: None ?Code Status: FULL  ? ?Pt Overview and Major Events to Date:  ?11/22/21: Admitted to FPTS  ? ?Assessment and Plan: ?Nicole Moss is a 31 y.o. female presenting with 3 day progressive abdominal pain, nausea, vomiting, diarrhea, dysuria, foul smelling urine, and frequency. PMH is significant for opiate use disorder on methadone and Chronic Hepatitis C. ? ?Abdominal Pain ?AKI ?Acute Pyelonephritis secondary to untreated UTI ?Lactic Acidosis  ?Continue CTX 2g >blood cx + enterobacterales and E. coli. Decreased IVF NS 141mL/hr, continuing compazine for nausea with prolonged QTC. Cr 1.59>1.29, GFR 57, wbc downtrending appropriately 26>13.5. Patient remains afebrile with most updated LA 2.1. Consider transition to oral tomorrow if afebrile ON.  ?- Continue CTX 2g for now  ?- Transition to oral as able  ?- await sensitivities  ?- continue IV compazine  ?- updated EKG pending  ?- Scheduled Tylenol and PRN Dilaudid for pain control  ? ?Electrolyte Derangement  ?AM K 3.7, Mg 1.3, Na 133.  ?-Mag repleted  ? ?Opiate Use Disorder  ?Restarted home methadone 50 mg daily after verified with outpatient methadone clinic. Await EKG results to determine Qtc.  ? ?Chronic Hepatitis C ?RNA quant ordered and pending.  ? ?FEN/GI: Regular  ?PPx: Lovenox  ?Dispo:Home pending clinical improvement . Barriers include transition to PO abx.  ? ?Subjective:  ?Patient is still experiencing nausea and abdominal pain. She has voided twice overnight.  ? ?Objective: ?Temp:  [98.2 ?F (36.8 ?C)-102.2 ?F (39 ?C)] 98.2 ?F (36.8 ?C) (04/24 0400) ?Pulse Rate:  [99-140] 116 (04/24 0602) ?Resp:  [8-22] 15 (04/24 0602) ?BP: (91-117)/(64-79) 104/78 (04/24 0602) ?SpO2:  [96 %-100 %] 99 % (04/24 0602) ?Weight:  [54.7  kg] 54.7 kg (04/23 2335) ?General: Alert and oriented  ?Heart: Regular rate and rhythm with no murmurs appreciated ?Lungs: CTA bilaterally, no wheezing ?Abdomen: Bowel sounds present, guarding throughout, TTP diffusely>more suprapubic pain ?Skin: Warm and dry ?Extremities: No lower extremity edema ? ? ?Laboratory: ?Recent Labs  ?Lab 11/22/21 ?1424 11/23/21 ?0500  ?WBC 26.0* 13.5*  ?HGB 14.7 11.5*  ?HCT 43.1 33.5*  ?PLT 158 89*  ? ?Recent Labs  ?Lab 11/22/21 ?1424 11/23/21 ?0500  ?NA 132* 133*  ?K 3.3* 3.7  ?CL 93* 105  ?CO2 23 21*  ?BUN 25* 20  ?CREATININE 1.59* 1.29*  ?CALCIUM 8.8* 7.3*  ?PROT 6.9  --   ?BILITOT 0.9  --   ?ALKPHOS 80  --   ?ALT 18  --   ?AST 36  --   ?GLUCOSE 178* 119*  ? ? ? ? ?Alfredo Martinez, MD ?11/23/2021, 6:58 AM ?PGY-1, [facility] ?FPTS Intern pager: [phone], text pages welcome ? ?

## 2021-11-23 NOTE — Hospital Course (Addendum)
Nicole Moss is a 31 y.o.female with a history of opiate use disorder and chronic hepatitis C who was admitted to the [facility] Teaching Service at [facility] for sepsis secondary to pyelonephritis. Her hospital course is detailed below: ? ?Acute pyelonephritis ?Sepsis ?Patient had 3 days of abdominal pain, chills, nausea, vomiting in the setting of 1 week of urinary frequency with foul-smelling urine and dysuria.  Patient was unable to tolerate p.o. intake and was tachycardic, tachypneic, and febrile on admission.  Patient was given Tylenol suppositories to control fever.  Patient was started on IV ceftriaxone and discharged on Cefadroxil to finish a 5 day course for her pyelonephritis (total of 7 days of abx).  Patient CT abdomen showed bilateral kidney hypodensities concerning for bilateral pyelonephritis. On discharge, stable without fever or tachycardia, tolerating PO. Patient does not have a PCP so we had the patient f/u with [facility]. ? ?Electrolyte derrangment ?Hypokalemia ?Patient was hypokalemic likely in the setting of poor p.o. intake and vomiting.  Appropriately repleted.  Discharge potassium level was 3.1>was repleted with 40 mEq prior to d/c. ? ?Opiate use Disorder ?Patient has a history of opiate use disorder and is currently on methadone with [facility].  Last dose had been 2 days prior to hospitalization.  We avoided opiate use for pain as able to. ? ?Chronic Hepatitis C ?Patient reports having a history of hepatitis C and was previously on Mavyret.  Patient is unsure if completed treatment and was lost to follow-up due to the pandemic.  HCV quant was obtained to assess for viral load.  Patient will require ID outpatient to follow-up. ? ?RUE Swelling  ?IV site swelling, DVT ruled out with U/S. Superficial thrombus noted.  ? ?Other chronic conditions were medically managed with home medications and formulary alternatives as necessary  ? ?PCP Follow-up Recommendations: ?-Completion of antibiotic  treatment and resolution of symptoms ?-HCV RNA quant level, ID follow up outpt ?-repeat CBC, BMP on discharge ?-may need iron panel, ferritin, B12/folate, smear outpatient to work up anemia  ? ?

## 2021-11-24 ENCOUNTER — Inpatient Hospital Stay (HOSPITAL_COMMUNITY): Payer: Medicaid Other

## 2021-11-24 ENCOUNTER — Other Ambulatory Visit (HOSPITAL_COMMUNITY): Payer: Self-pay

## 2021-11-24 DIAGNOSIS — N12 Tubulo-interstitial nephritis, not specified as acute or chronic: Secondary | ICD-10-CM | POA: Diagnosis not present

## 2021-11-24 DIAGNOSIS — M7989 Other specified soft tissue disorders: Secondary | ICD-10-CM

## 2021-11-24 DIAGNOSIS — M79601 Pain in right arm: Secondary | ICD-10-CM

## 2021-11-24 LAB — URINE CULTURE: Culture: >=100000 — AB

## 2021-11-24 LAB — CBC
HCT: 29.8 % — ABNORMAL LOW (ref 36.0–46.0)
Hemoglobin: 10.4 g/dL — ABNORMAL LOW (ref 12.0–15.0)
MCH: 30.1 pg (ref 26.0–34.0)
MCHC: 34.9 g/dL (ref 30.0–36.0)
MCV: 86.4 fL (ref 80.0–100.0)
Platelets: 99 K/uL — ABNORMAL LOW (ref 150–400)
RBC: 3.45 MIL/uL — ABNORMAL LOW (ref 3.87–5.11)
RDW: 12.7 % (ref 11.5–15.5)
WBC: 12.8 K/uL — ABNORMAL HIGH (ref 4.0–10.5)
nRBC: 0 % (ref 0.0–0.2)

## 2021-11-24 LAB — BASIC METABOLIC PANEL WITH GFR
Anion gap: 5 (ref 5–15)
BUN: 11 mg/dL (ref 6–20)
CO2: 23 mmol/L (ref 22–32)
Calcium: 7.5 mg/dL — ABNORMAL LOW (ref 8.9–10.3)
Chloride: 105 mmol/L (ref 98–111)
Creatinine, Ser: 1.24 mg/dL — ABNORMAL HIGH (ref 0.44–1.00)
GFR, Estimated: >60 mL/min
Glucose, Bld: 95 mg/dL (ref 70–99)
Potassium: 3.1 mmol/L — ABNORMAL LOW (ref 3.5–5.1)
Sodium: 133 mmol/L — ABNORMAL LOW (ref 135–145)

## 2021-11-24 LAB — HIV ANTIBODY (ROUTINE TESTING W REFLEX): HIV Screen 4th Generation wRfx: NONREACTIVE

## 2021-11-24 LAB — MAGNESIUM: Magnesium: 2.1 mg/dL (ref 1.7–2.4)

## 2021-11-24 MED ORDER — POTASSIUM CHLORIDE 20 MEQ PO PACK
40.0000 meq | PACK | ORAL | Status: AC
  Filled 2021-11-24: qty 2

## 2021-11-24 MED ORDER — ACETAMINOPHEN 325 MG PO TABS: 650.0000 mg | ORAL_TABLET | Freq: Four times a day (QID) | ORAL | Status: AC | PRN

## 2021-11-24 MED ORDER — CEFADROXIL 500 MG PO CAPS
1000.0000 mg | ORAL_CAPSULE | Freq: Two times a day (BID) | ORAL | Status: DC
  Administered 2021-11-24: 1000 mg via ORAL
  Filled 2021-11-24 (×2): qty 2

## 2021-11-24 MED ORDER — OXYCODONE HCL 5 MG PO TABS: 5.0000 mg | ORAL_TABLET | ORAL | Status: DC | PRN

## 2021-11-24 MED ORDER — POTASSIUM CHLORIDE CRYS ER 20 MEQ PO TBCR
40.0000 meq | EXTENDED_RELEASE_TABLET | ORAL | Status: AC
  Administered 2021-11-24 (×2): 40 meq via ORAL
  Filled 2021-11-24 (×2): qty 2

## 2021-11-24 MED ORDER — CEFADROXIL 500 MG PO CAPS
1000.0000 mg | ORAL_CAPSULE | Freq: Two times a day (BID) | ORAL | 0 refills | Status: AC
Start: 2021-11-24 — End: 2021-11-29
  Filled 2021-11-24: qty 19, 5d supply, fill #0

## 2021-11-24 NOTE — Plan of Care (Signed)

## 2021-11-24 NOTE — Discharge Instructions (Addendum)
Dear Rande Brunt,  ? ?Thank you so much for allowing [place] to be part of your care!  You were admitted to [facility] for a kidney infection called pyelonephritis.  ? ?We will continue with an antibiotic called Cefadroxil for a total of 5 days from discharge. Please take Tylenol as needed for pain, and we will see you in clinic later this week.  ? ? ?POST-HOSPITAL & CARE INSTRUCTIONS ?Please complete your course of Cefadroxil  ?Please let PCP/Specialists know of any changes that were made.  ?Please see medications section of this packet for any medication changes.  ? ?DOCTOR'S APPOINTMENT & FOLLOW UP CARE INSTRUCTIONS  ?Future Appointments  ?Date Time Provider [place]  ?11/24/2021 11:00 AM [facility] US 3 [facility]  ? ? ?RETURN PRECAUTIONS: ? ?Please return for chills, fever, shortness of breath, chest pain,  urinary complications.  ? ?Take care and be well! ? ?Family Medicine Teaching Service  ?[place]  ?[facility]  ?[address] [place], [place] [address] ?[phone] ? ?

## 2021-11-24 NOTE — Progress Notes (Addendum)
Family Medicine Teaching Service ?Daily Progress Note ?Intern Pager: [phone] ? ?Patient name: Nicole Moss Medical record number: [id number] ?Date of birth: 1990/11/22 Age: 31 y.o. Gender: female ? ?Primary Care Provider: Alfredo Martinez, MD ?Consultants: None  ?Code Status: FULL  ? ?Pt Overview and Major Events to Date:  ?11/22/21: Admitted to FPTS  ? ?Assessment and Plan: ? ?Nicole Moss is a 31 y.o. female presenting with acute pyelonephritis and E. Coli bacteremia. PMH is significant for opiate use disorder on methadone and Chronic Hepatitis C. ? ?Acute Pyelonephritis  ?E. Coli bacteremia  ?AKI  ?Patient remains tachycardic but has been afebrile for greater than 24 hours. Urine culture positive for E. Coli> will transition to oral antibiotics today, starting Cefadroxil pending sensitivities.  Leukocytosis appropriately downtrending 13.5 > 12.8. Cr 1.59>1.29, not enough data to assess current baseline, would benefit from updated BMP after infection is treated. No need for breakthrough pain medication overnight but has been taking tylenol.  ?- d/c CTX >start cefadroxil for total of 7 days  ?- monitor symptoms  ?- transitioned to oxy 5mg  q4prn from dilaudid  ?- tylenol 650mg  q6prn ?- lovenox for VTE ppx  ?- regular diet  ? ?RUE Swelling  ?Pain and swelling in the RUE at IV site, slight warmth. Possibly extravasation of abx versus superficial thrombus vs DVT. Will further evaluate with U/S of RUE to r/o DVT.  ?- F/u vascular U/S  ? ?Electrolyte derangements ?Sodium 133, potassium 3.1> repleted with 40 mEq. Mag normal at 2.1.  ?- Replete as indicated  ? ?Normocytic anemia ?Patient has hemoglobin 11.5 > 10.4 with normal MCV.  Likely IDA with hemodilution.  No signs of active bleed.  Patient would likely benefit from iron panel, ferritin, B12/folate, peripheral smear, can continue with this outpatient versus inpatient depending on discharge date.  ?- Consider iron studies, ferritin, vitamin B12/folate  ? ?Opiate Use  Disorder  ?Restarted home Methadone, [facility] confirmed dosage.  ? ?Chronic Hepatitis C ?RNA quant pending  ? ?FEN/GI: Regular ?PPx: Lovenox  ?Dispo:Home  today or tomorrow . Barriers include U/S results and transition to oral abx with sensitivities.  ? ?Subjective:  ?Patient reports that her pain has improved greatly, and she has no problems with urination.  ? ?Objective: ?Temp:  [98.8 ?F (37.1 ?C)-99.8 ?F (37.7 ?C)] 98.8 ?F (37.1 ?C) (04/25 0805) ?Pulse Rate:  [85-116] 85 (04/25 0805) ?Resp:  [14-19] 17 (04/25 0805) ?BP: (108-128)/(78-86) 108/82 (04/25 0805) ?SpO2:  [97 %-100 %] 99 % (04/25 0805) ?General: Alert and oriented in no apparent distress, standing and washing hands  ?Heart: Regular rate and rhythm with no murmurs appreciated ?Lungs: CTA bilaterally, no wheezing ?Abdomen: Bowel sounds present, no abdominal pain, CVA tenderness still present  ?Skin: Warm and dry ?Extremities: RUE mild swelling, erythema and warmth around IV site  ? ? ?Laboratory: ?Recent Labs  ?Lab 11/22/21 ?1424 11/23/21 ?0500 11/24/21 ?0324  ?WBC 26.0* 13.5* 12.8*  ?HGB 14.7 11.5* 10.4*  ?HCT 43.1 33.5* 29.8*  ?PLT 158 89* 99*  ? ?Recent Labs  ?Lab 11/22/21 ?1424 11/23/21 ?0500 11/24/21 ?0324  ?NA 132* 133* 133*  ?K 3.3* 3.7 3.1*  ?CL 93* 105 105  ?CO2 23 21* 23  ?BUN 25* 20 11  ?CREATININE 1.59* 1.29* 1.24*  ?CALCIUM 8.8* 7.3* 7.5*  ?PROT 6.9  --   --   ?BILITOT 0.9  --   --   ?ALKPHOS 80  --   --   ?ALT 18  --   --   ?AST 36  --   --   ?  GLUCOSE 178* 119* 95  ? ? ? ? ?Alfredo Martinez, MD ?11/24/2021, 9:15 AM ?PGY-1, [facility] ?FPTS Intern pager: [phone], text pages welcome ? ?

## 2021-11-24 NOTE — Progress Notes (Signed)
Upper extremity venous RT study completed.   Please see CV Proc for preliminary results.   Castiel Lauricella, RDMS, RVT  

## 2021-11-24 NOTE — Discharge Summary (Addendum)
Family Medicine Teaching Service ?Hospital Discharge Summary ? ?Patient name: Nicole Moss Medical record number: [id number] ?Date of birth: 02/07/1991 Age: 31 y.o. Gender: female ?Date of Admission: 11/22/2021  Date of Discharge: 11/24/21 ?Admitting Physician: Park Pope, MD ? ?Primary Care Provider: Alfredo Martinez, MD ?Consultants: None  ? ?Indication for Hospitalization: Acute pyelonephritis  ? ?Discharge Diagnoses/Problem List:  ?Principal Problem: ?  Pyelonephritis ?Active Problems: ?  Chronic hepatitis C without hepatic coma (HCC) ?  Opioid use disorder ?  Sepsis (HCC) ?  AKI (acute kidney injury) (HCC) ?  Hypokalemia ?  Underweight ?  Hypomagnesemia ? ?Disposition: Home  ? ?Discharge Condition: Stable ? ?Discharge Exam: Blood pressure 108/82, pulse 85, temperature 98.8 ?F (37.1 ?C), temperature source Oral, resp. rate 17, height 5\' 10"  (1.778 m), weight 54.7 kg, last menstrual period 11/08/2021, SpO2 98 %, unknown if currently breastfeeding. ?General: Alert and oriented in no apparent distress, standing and washing hands  ?Heart: Regular rate and rhythm with no murmurs appreciated ?Lungs: CTA bilaterally, no wheezing ?Abdomen: Bowel sounds present, no abdominal pain, CVA tenderness still present  ?Skin: Warm and dry ?Extremities: RUE mild swelling, erythema and warmth around IV site  ?  ? ?Brief Hospital Course:  ?Nicole Moss is a 31 y.o.female with a history of opiate use disorder and chronic hepatitis C who was admitted to the [facility] Teaching Service at [facility] for sepsis secondary to pyelonephritis. Her hospital course is detailed below: ? ?Acute pyelonephritis ?Sepsis ?Patient had 3 days of abdominal pain, chills, nausea, vomiting in the setting of 1 week of urinary frequency with foul-smelling urine and dysuria.  Patient was unable to tolerate p.o. intake and was tachycardic, tachypneic, and febrile on admission.  Patient was given Tylenol suppositories to control fever.  Patient was started on IV  ceftriaxone and discharged on Cefadroxil to finish a 5 day course for her pyelonephritis (total of 7 days of abx).  Patient CT abdomen showed bilateral kidney hypodensities concerning for bilateral pyelonephritis. On discharge, stable without fever or tachycardia, tolerating PO. Patient does not have a PCP so we had the patient f/u with [facility]. ? ?Electrolyte derrangment ?Hypokalemia ?Patient was hypokalemic likely in the setting of poor p.o. intake and vomiting.  Appropriately repleted.  Discharge potassium level was 3.1>was repleted with 40 mEq prior to d/c. ? ?Opiate use Disorder ?Patient has a history of opiate use disorder and is currently on methadone with [facility].  Last dose had been 2 days prior to hospitalization.  We avoided opiate use for pain as able to. ? ?Chronic Hepatitis C ?Patient reports having a history of hepatitis C and was previously on Mavyret.  Patient is unsure if completed treatment and was lost to follow-up due to the pandemic.  HCV quant was obtained to assess for viral load.  Patient will require ID outpatient to follow-up. ? ?RUE Swelling  ?IV site swelling, DVT ruled out with U/S. Superficial thrombus noted.  ? ?Other chronic conditions were medically managed with home medications and formulary alternatives as necessary  ? ?PCP Follow-up Recommendations: ?-Completion of antibiotic treatment and resolution of symptoms ?-HCV RNA quant level, ID follow up outpt ?-repeat CBC, BMP on discharge ?-may need iron panel, ferritin, B12/folate, smear outpatient to work up anemia  ? ? ? ?Significant Procedures: None  ? ?Significant Labs and Imaging:  ?Recent Labs  ?Lab 11/22/21 ?1424 11/23/21 ?0500 11/24/21 ?0324  ?WBC 26.0* 13.5* 12.8*  ?HGB 14.7 11.5* 10.4*  ?HCT 43.1 33.5* 29.8*  ?PLT 158 89* 99*  ? ?  Recent Labs  ?Lab 11/22/21 ?1424 11/23/21 ?0500 11/23/21 ?1835 11/24/21 ?0324  ?NA 132* 133*  --  133*  ?K 3.3* 3.7  --  3.1*  ?CL 93* 105  --  105  ?CO2 23 21*  --  23  ?GLUCOSE 178* 119*  --  95   ?BUN 25* 20  --  11  ?CREATININE 1.59* 1.29*  --  1.24*  ?CALCIUM 8.8* 7.3*  --  7.5*  ?MG  --  1.3* 2.0 2.1  ?ALKPHOS 80  --   --   --   ?AST 36  --   --   --   ?ALT 18  --   --   --   ?ALBUMIN 3.4*  --   --   --   ? ? ?CT ABDOMEN PELVIS W CONTRAST ? ?Result Date: 11/22/2021 ?CLINICAL DATA:  31 year old female with acute RIGHT abdominal and pelvic pain. EXAM: CT ABDOMEN AND PELVIS WITH CONTRAST TECHNIQUE: Multidetector CT imaging of the abdomen and pelvis was performed using the standard protocol following bolus administration of intravenous contrast. RADIATION DOSE REDUCTION: This exam was performed according to the departmental dose-optimization program which includes automated exposure control, adjustment of the mA and/or kV according to patient size and/or use of iterative reconstruction technique. CONTRAST:  OMNIPAQUE IOHEXOL 300 MG/ML  SOLN COMPARISON:  03/04/2015 CT FINDINGS: Lower chest: No acute abnormality. Hepatobiliary: The liver and gallbladder are unremarkable. There is no evidence of intrahepatic or extrahepatic biliary dilatation. Pancreas: Unremarkable Spleen: Unremarkable Adrenals/Urinary Tract: The RIGHT kidney is enlarged with a striated appearance/multiple wedge-shaped peripheral hypodense areas and RIGHT perinephric stranding. Small hypodense wedge-shaped areas within the MEDIAL LEFT kidney are also noted. This findings most likely represents infection/pyelonephritis. No discrete abscess, renal gas or hydronephrosis. The renal arteries and veins appear patent. The adrenal glands are unremarkable.  The bladder is collapsed. Stomach/Bowel: Stomach is within normal limits. Appendix appears normal. No evidence of bowel wall thickening, distention, or inflammatory changes. Vascular/Lymphatic: No significant vascular findings are present. No enlarged abdominal or pelvic lymph nodes. Reproductive: Uterus and bilateral adnexa are unremarkable except for an IUD. Other: No ascites, focal  collection or pneumoperitoneum. Musculoskeletal: No acute or suspicious bony abnormalities are noted. Remote compression fracture of T12 again noted. IMPRESSION: 1. Enlarged RIGHT kidney with striated appearance/multiple wedge-shaped peripheral hypodense areas and RIGHT perinephric stranding. Small wedge-shaped areas of hypodensity within the MEDIAL LEFT kidney. This findings most likely represents infection/pyelonephritis but correlate with labs. No discrete abscess, renal gas or hydronephrosis. 2. No other acute abnormalities. Electronically Signed   By: Harmon Pier M.D.   On: 11/22/2021 16:12  ? ?VAS [place] UPPER EXTREMITY VENOUS DUPLEX ? ?Result Date: 11/24/2021 ?UPPER VENOUS STUDY  Patient Name:  KIYLAH LOYER  Date of Exam:   11/24/2021 Medical Rec #: [id number]       Accession #:    [id number] Date of Birth: Oct 15, 1990       Patient Gender: F Patient Age:   30 years Exam Location:  [facility] Procedure:      VAS [place] UPPER EXTREMITY VENOUS DUPLEX Referring Phys: TODD MCDIARMID --------------------------------------------------------------------------------  Indications: Pain and swelling at IV site RT forearm Comparison Study: No prior studies. Performing Technologist: Jean Rosenthal RDMS, RVT  Examination Guidelines: A complete evaluation includes B-mode imaging, spectral Doppler, color Doppler, and power Doppler as needed of all accessible portions of each vessel. Bilateral testing is considered an integral part of a complete examination. Limited examinations for reoccurring indications may be  performed as noted.  Right Findings: +----------+------------+---------+-----------+----------+---------------------+ RIGHT     CompressiblePhasicitySpontaneousProperties       Summary        +----------+------------+---------+-----------+----------+---------------------+ IJV           Full       Yes       Yes                                     +----------+------------+---------+-----------+----------+---------------------+ Subclavian               Yes       Yes                                    +----------+------------+---------+-----------+----------+---------------------+ Axillary      Full       Yes       Yes

## 2021-11-25 LAB — CULTURE, BLOOD (ROUTINE X 2)

## 2021-11-27 ENCOUNTER — Inpatient Hospital Stay: Payer: Medicaid Other | Admitting: Student

## 2021-11-27 LAB — CULTURE, BLOOD (ROUTINE X 2)
Culture: NO GROWTH
Special Requests: ADEQUATE

## 2021-11-27 NOTE — Progress Notes (Deleted)
    SUBJECTIVE:   CHIEF COMPLAINT / HPI:   Hospital followup Patient had recent hospitalization for acute pyelonephritis with E coli bacteremia. Completed seven day course  of antibiotics ***. Feeling ***.   Chronic Hep C Patient has a history of chronic hep c. Previously on [place] but did not complete treatment, lost due follow-up due to pandemic. HCV quant not obtained in hospital.   Anemia Noted to be anemic to 10.4 at hospitalization. D/c summary recommends anemia panel.    PERTINENT  PMH / PSH: ***  OBJECTIVE:   LMP 11/08/2021   ***  ASSESSMENT/PLAN:   No problem-specific Assessment & Plan notes found for this encounter.     Pearla Dubonnet, MD [place]

## 2022-09-29 ENCOUNTER — Other Ambulatory Visit (HOSPITAL_COMMUNITY): Payer: Self-pay

## 2023-02-10 ENCOUNTER — Ambulatory Visit: Payer: Medicaid Other | Admitting: Obstetrics and Gynecology

## 2024-03-18 ENCOUNTER — Encounter (HOSPITAL_COMMUNITY): Payer: Self-pay

## 2024-03-18 ENCOUNTER — Emergency Department (HOSPITAL_COMMUNITY)
Admission: EM | Admit: 2024-03-18 | Discharge: 2024-03-18 | Disposition: A | Discharge status: Home / Self Care | Payer: MEDICAID | Attending: Emergency Medicine | Admitting: Emergency Medicine

## 2024-03-18 ENCOUNTER — Emergency Department (HOSPITAL_COMMUNITY): Payer: MEDICAID

## 2024-03-18 ENCOUNTER — Other Ambulatory Visit: Payer: Self-pay

## 2024-03-18 DIAGNOSIS — F12188 Cannabis abuse with other cannabis-induced disorder: Secondary | ICD-10-CM | POA: Insufficient documentation

## 2024-03-18 DIAGNOSIS — R109 Unspecified abdominal pain: Secondary | ICD-10-CM | POA: Diagnosis present

## 2024-03-18 DIAGNOSIS — R112 Nausea with vomiting, unspecified: Secondary | ICD-10-CM

## 2024-03-18 DIAGNOSIS — F1290 Cannabis use, unspecified, uncomplicated: Secondary | ICD-10-CM

## 2024-03-18 LAB — URINALYSIS, ROUTINE W REFLEX MICROSCOPIC
Bilirubin Urine: NEGATIVE
Glucose, UA: NEGATIVE mg/dL
Hgb urine dipstick: NEGATIVE
Ketones, ur: NEGATIVE mg/dL
Leukocytes,Ua: NEGATIVE
Nitrite: NEGATIVE
Protein, ur: 100 mg/dL — AB
Specific Gravity, Urine: >1.046 — ABNORMAL HIGH (ref 1.005–1.030)
pH: 8 (ref 5.0–8.0)

## 2024-03-18 LAB — COMPREHENSIVE METABOLIC PANEL WITH GFR
ALT: 25 U/L (ref 0–44)
AST: 32 U/L (ref 15–41)
Albumin: 5.3 g/dL — ABNORMAL HIGH (ref 3.5–5.0)
Alkaline Phosphatase: 75 U/L (ref 38–126)
Anion gap: 17 — ABNORMAL HIGH (ref 5–15)
BUN: 19 mg/dL (ref 6–20)
CO2: 29 mmol/L (ref 22–32)
Calcium: 10.8 mg/dL — ABNORMAL HIGH (ref 8.9–10.3)
Chloride: 95 mmol/L — ABNORMAL LOW (ref 98–111)
Creatinine, Ser: 1.17 mg/dL — ABNORMAL HIGH (ref 0.44–1.00)
GFR, Estimated: >60 mL/min (ref >60)
Glucose, Bld: 150 mg/dL — ABNORMAL HIGH (ref 70–99)
Potassium: 3.8 mmol/L (ref 3.5–5.1)
Sodium: 141 mmol/L (ref 135–145)
Total Bilirubin: 1.3 mg/dL — ABNORMAL HIGH (ref 0.0–1.2)
Total Protein: 9.1 g/dL — ABNORMAL HIGH (ref 6.5–8.1)

## 2024-03-18 LAB — TROPONIN I (HIGH SENSITIVITY): Troponin I (High Sensitivity): 17 ng/L (ref <18)

## 2024-03-18 LAB — I-STAT CG4 LACTIC ACID, ED
Lactic Acid, Venous: 2.1 mmol/L (ref 0.5–1.9)
Lactic Acid, Venous: 1.6 mmol/L (ref 0.5–1.9)
Lactic Acid, Venous: 1.7 mmol/L (ref 0.5–1.9)

## 2024-03-18 LAB — CBC
HCT: 51.7 % — ABNORMAL HIGH (ref 36.0–46.0)
Hemoglobin: 17.9 g/dL — ABNORMAL HIGH (ref 12.0–15.0)
MCH: 30.1 pg (ref 26.0–34.0)
MCHC: 34.6 g/dL (ref 30.0–36.0)
MCV: 86.9 fL (ref 80.0–100.0)
Platelets: 189 K/uL (ref 150–400)
RBC: 5.95 MIL/uL — ABNORMAL HIGH (ref 3.87–5.11)
RDW: 12.6 % (ref 11.5–15.5)
WBC: 18.6 K/uL — ABNORMAL HIGH (ref 4.0–10.5)
nRBC: 0 % (ref 0.0–0.2)

## 2024-03-18 LAB — RESP PANEL BY RT-PCR (RSV, FLU A&B, COVID)  RVPGX2
Influenza A by PCR: NEGATIVE
Influenza B by PCR: NEGATIVE
Resp Syncytial Virus by PCR: NEGATIVE
SARS Coronavirus 2 by RT PCR: NEGATIVE

## 2024-03-18 LAB — HCG, SERUM, QUALITATIVE: Preg, Serum: NEGATIVE

## 2024-03-18 LAB — LIPASE, BLOOD: Lipase: 21 U/L (ref 11–51)

## 2024-03-18 LAB — MAGNESIUM: Magnesium: 1.6 mg/dL — ABNORMAL LOW (ref 1.7–2.4)

## 2024-03-18 MED ORDER — ONDANSETRON HCL 4 MG/2ML IJ SOLN
4.0000 mg | Freq: Once | INTRAMUSCULAR | Status: AC
  Administered 2024-03-18: 4 mg via INTRAVENOUS
  Filled 2024-03-18: qty 2

## 2024-03-18 MED ORDER — LACTATED RINGERS IV SOLN: INTRAVENOUS | Status: DC

## 2024-03-18 MED ORDER — SODIUM CHLORIDE 0.9 % IV SOLN
2.0000 g | Freq: Once | INTRAVENOUS | Status: AC
  Administered 2024-03-18: 2 g via INTRAVENOUS
  Filled 2024-03-18 (×2): qty 12.5

## 2024-03-18 MED ORDER — MAGNESIUM SULFATE 2 GM/50ML IV SOLN
2.0000 g | Freq: Once | INTRAVENOUS | Status: AC
  Administered 2024-03-18: 2 g via INTRAVENOUS
  Filled 2024-03-18: qty 50

## 2024-03-18 MED ORDER — POTASSIUM CHLORIDE CRYS ER 20 MEQ PO TBCR
40.0000 meq | EXTENDED_RELEASE_TABLET | Freq: Once | ORAL | Status: AC
  Administered 2024-03-18: 40 meq via ORAL
  Filled 2024-03-18: qty 2

## 2024-03-18 MED ORDER — LACTATED RINGERS IV BOLUS
1000.0000 mL | Freq: Once | INTRAVENOUS | Status: AC
Start: 2024-03-18 — End: 2024-03-18
  Administered 2024-03-18: 1000 mL via INTRAVENOUS

## 2024-03-18 MED ORDER — VANCOMYCIN HCL IN DEXTROSE 1-5 GM/200ML-% IV SOLN
1000.0000 mg | Freq: Once | INTRAVENOUS | Status: AC
  Administered 2024-03-18: 1000 mg via INTRAVENOUS
  Filled 2024-03-18: qty 200

## 2024-03-18 MED ORDER — METRONIDAZOLE 500 MG/100ML IV SOLN
500.0000 mg | Freq: Once | INTRAVENOUS | Status: AC
  Administered 2024-03-18: 500 mg via INTRAVENOUS
  Filled 2024-03-18: qty 100

## 2024-03-18 MED ORDER — LACTATED RINGERS IV BOLUS (SEPSIS)
500.0000 mL | Freq: Once | INTRAVENOUS | Status: AC
  Administered 2024-03-18: 500 mL via INTRAVENOUS

## 2024-03-18 MED ORDER — IOHEXOL 350 MG/ML SOLN
75.0000 mL | Freq: Once | INTRAVENOUS | Status: AC | PRN
  Administered 2024-03-18: 75 mL via INTRAVENOUS

## 2024-03-18 NOTE — Sepsis Progress Note (Signed)
 Elink following code sepsis

## 2024-03-18 NOTE — ED Triage Notes (Signed)
 Pt c/o vomiting and abdominal pain, has had some diarrhea that started two days ago. Pt states that when she felt like this before, she was septic.

## 2024-03-18 NOTE — ED Notes (Signed)
 Lactic resultsgiven to dylan g.rn by at

## 2024-03-18 NOTE — Discharge Instructions (Addendum)
 You were seen today for nausea and vomiting. While you were here we monitored your vitals, preformed a physical exam, and labs and imaging. These were all reassuring and there is no indication for any further testing or intervention in the emergency department at this time.   Things to do:  - Follow up with your primary care provider within the next 1-2 weeks - Please follow-up with your methadone  clinic, you may need to be transition to Suboxone due to the changes on your EKG which may cause arrhythmia if they get worse.  Return to the emergency department if you have any new or worsening symptoms including, or if you have any other concerns.

## 2024-03-18 NOTE — ED Notes (Signed)
 While hooked to dinamap in triage, pt's HR noted to change from 38bpm to 128bpm, correlating with radial pulse. Pt lethargic in triage, able to answer questions, but rests w/head down when not engaged. Pt denies taking anything today.

## 2024-03-18 NOTE — ED Provider Notes (Signed)
 [place] EMERGENCY DEPARTMENT AT [facility] Provider Note  MDM   HPI/ROS:  Nicole Moss is a 33 y.o. female with a medical history as below who presents with nausea vomiting diarrhea and abdominal pain that has been occurring for 3 days.  Emesis is nonbloody and nonbilious.  Described as mucousy.  She denies any recent fevers hematochezia, melena or hematemesis.  Denies dysuria urgency, urgency.  Also denies any medications alcohol or drug use.  Denies vaginal discharge.   Physical exam is notable for: - Right upper quadrant right lower quadrant abdominal pain as well as some epigastric tenderness.  Guarding in the right lower quadrant  On my initial evaluation, patient is:  -Vital signs stable, mild tachycardia. Patient afebrile, hemodynamically stable, and ill appearing. -Additional history obtained from chart review   Differentials include Appendicitis, Bowel Obstruction, AAA, UTI, Pyelonephritis, Nephrolithiasis, Pancreatitis, Cholecystitis, Shingles, Perforated Bowel or Ulcer, Diverticulosis/itis, Ischemic Mesentery, Inflammatory Bowel Disease, Strangulated/Incarcerated Hernia, PID, Intrauterine Pregnancy, Ectopic Pregnancy, Ovarian Torsion, Tubal Ovarian Abscess, STD.    On initial assessment patient does appear uncomfortable.  She has some focal right lower quadrant, right upper quadrant and epigastric tenderness with some guarding in the right lower quadrant.  Will obtain CT scan.  She was given Zofran  however after found to have prolonged QT.  States she has been on methadone  for 7 years.  Likely culprit.  Her symptoms did improve with Zofran  and IV fluids.  Did not provide any other QT prolonging medications.  She did look relatively dehydrated with significant white count thus sepsis protocol initiated.  He had abrupt resolution of symptoms after medications and fluids was able to tolerate p.o.  Her workup is overwhelmingly unremarkable and with no obvious source of  infection.  She also has a history of cannabis use with cannabis hyperemesis.  Given she feels much improved and is tolerating p.o. no longer having abdominal pain believe this may be the likely culprit.  We discussed return precautions, we also discussed her methadone  use and prolonged QT and the possible need for a switch to Suboxone.  At the very least she needs frequent EKGs to continue to monitor her QT.  She states understanding is in agreement with that plan.  Discharged in stable condition.  Interpretations, interventions, and the patient's course of care are documented below.    Clinical Course as of 03/18/24 2226  Sun Mar 18, 2024  1543 WBC(!): 18.6 Significant leukocytosis [RC]  1543 Hemoglobin(!): 17.9 Polycythemia [RC]  1543 Lactic Acid, Venous(!!): 2.1 IV fluids given [RC]  1547 Sinus rhythm with PVCs.  Prolonged QTc with normal axis.  No STE or depression.  Osborne waves present.  No hypothermia, chest pain.  No Brugada [RC]  1557 Previously tolerated cephalosporins. Abx given for SIRS [RC]  1617 Creatinine(!): 1.17 Improved from prior.  IV fluids given [RC]  1617 CO2: 29 No acidosis [RC]  1617 Calcium(!): 10.8 Mild hypercalcemia.  Otherwise no clinically significant electrolyte abnormalities. [RC]  1726 CT ABDOMEN PELVIS W CONTRAST No acute intra-abdominal pathology [RC]  1759 Magnesium (!): 1.6 Replete  [RC]  1857 Resp panel by RT-PCR (RSV, Flu A&B, Covid) Anterior Nasal Swab [RC]  1858 DG Chest 1 View No pneumonia.  Normal CXR [RC]  1858 Lactic Acid, Venous: 1.7 Improved with fluids [RC]  1858 Lipase: 21 Doubt pancreatitis negative CT [RC]  1958 Bacteria, UA(!): RARE Rare bacteria with squames and no significant pyuria negative nitrite and leukocyte esterase.  Doubt UTI as no urinary symptoms [RC]  2034 EKG 12-Lead Diffuse T wave inversions.  Troponin ordered but no chest pain. Prolonged QT likely secondary to methadone  [RC]  2208 Troponin I (High Sensitivity):  17 [RC]    Clinical Course User Index [RC] Sharyne Darina RAMAN, MD      Disposition:  I discussed the plan for discharge with the patient and/or their surrogate at bedside prior to discharge and they were in agreement with the plan and verbalized understanding of the return precautions provided. All questions answered to the best of my ability. Ultimately, the patient was discharged in stable condition with stable vital signs. I am reassured that they are capable of close follow up and good social support at home.   Clinical Impression:  1. Cannabinoid hyperemesis syndrome   2. Nausea and vomiting, unspecified vomiting type     The plan for this patient was discussed with Dr. Yolande, who voiced agreement and who oversaw evaluation and treatment of this patient.   Clinical Complexity A medically appropriate history, review of systems, and physical exam was performed.  My independent interpretations of EKG, labs, and radiology are documented in the ED course above.   If decision rules were used in this patient's evaluation, they are listed below.   Click here for ABCD2, HEART and other calculatorsREFRESH Note before signing   Patient's presentation is most consistent with acute presentation with potential threat to life or bodily function.  Medical Decision Making Amount and/or Complexity of Data Reviewed Labs: ordered. Decision-making details documented in ED Course. Radiology: ordered. Decision-making details documented in ED Course. ECG/medicine tests:  Decision-making details documented in ED Course.  Risk Prescription drug management.    HPI/ROS      See MDM section for pertinent HPI and ROS. A complete ROS was performed with pertinent positives/negatives noted above.   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 70mg  qday [ ]  NICU consult   Opioid abuse (HCC) 03/15/2013   Polysubstance dependence including opioid type drug, episodic abuse (HCC) 03/15/2013    Past Surgical History:  Procedure Laterality Date   WISDOM TOOTH EXTRACTION        Physical Exam   Vitals:   03/18/24 1845 03/18/24 1930 03/18/24 2030 03/18/24 2145  BP: 101/62 105/74 96/63 96/61   Pulse: 90 92 73 72  Resp: 19 11 11 14   Temp:      TempSrc:      SpO2: 99% 100% 98% 99%  Weight:      Height:        Physical Exam Vitals and nursing note reviewed.  Constitutional:      General: She is not in acute distress.    Appearance: She is well-developed. She is ill-appearing.  HENT:     Head: Normocephalic and atraumatic.  Eyes:     Conjunctiva/sclera: Conjunctivae normal.  Cardiovascular:     Rate and Rhythm: Normal rate and regular rhythm.     Heart sounds: No murmur heard. Pulmonary:     Effort: Pulmonary effort is  normal. No respiratory distress.     Breath sounds: Normal breath sounds.  Abdominal:     Palpations: Abdomen is soft.     Tenderness: There is abdominal tenderness in the right upper quadrant, right lower quadrant and epigastric area. There is guarding. There is no right CVA tenderness, left CVA tenderness or rebound. Negative signs include Murphy's sign.  Musculoskeletal:        General: No swelling.     Cervical back: Neck supple.  Skin:    General: Skin is warm and dry.     Capillary Refill: Capillary refill takes less than 2 seconds.  Neurological:     Mental Status: She is alert.  Psychiatric:        Mood and Affect: Mood normal.      Procedures   If procedures were preformed on this  patient, they are listed below:  Procedures   @BBSIG @   Please note that this documentation was produced with the assistance of voice-to-text technology and may contain errors.    Sharyne Darina RAMAN, MD 03/18/24 2240    Yolande Lamar BROCKS, MD 03/19/24 2002

## 2024-03-19 LAB — I-STAT CHEM 8, ED
BUN: 22 mg/dL — ABNORMAL HIGH (ref 6–20)
Calcium, Ion: 1.08 mmol/L — ABNORMAL LOW (ref 1.15–1.40)
Chloride: 97 mmol/L — ABNORMAL LOW (ref 98–111)
Creatinine, Ser: 1 mg/dL (ref 0.44–1.00)
Glucose, Bld: 149 mg/dL — ABNORMAL HIGH (ref 70–99)
HCT: 55 % — ABNORMAL HIGH (ref 36.0–46.0)
Hemoglobin: 18.7 g/dL — ABNORMAL HIGH (ref 12.0–15.0)
Potassium: 3.6 mmol/L (ref 3.5–5.1)
Sodium: 139 mmol/L (ref 135–145)
TCO2: 28 mmol/L (ref 22–32)

## 2024-03-23 LAB — CULTURE, BLOOD (ROUTINE X 2)
Culture: NO GROWTH
Culture: NO GROWTH
